# Patient Record
Sex: Male | Born: 1940
Health system: Southern US, Community
[De-identification: ages and names within clinical notes are randomized; demographics above are authoritative.]

## PROBLEM LIST (undated history)

## (undated) DIAGNOSIS — I10 Essential (primary) hypertension: Secondary | ICD-10-CM

## (undated) DIAGNOSIS — G8929 Other chronic pain: Secondary | ICD-10-CM

## (undated) DIAGNOSIS — D494 Neoplasm of unspecified behavior of bladder: Secondary | ICD-10-CM

## (undated) DIAGNOSIS — Z87442 Personal history of urinary calculi: Secondary | ICD-10-CM

## (undated) DIAGNOSIS — E119 Type 2 diabetes mellitus without complications: Secondary | ICD-10-CM

## (undated) DIAGNOSIS — S060X9A Concussion with loss of consciousness of unspecified duration, initial encounter: Secondary | ICD-10-CM

## (undated) DIAGNOSIS — M1990 Unspecified osteoarthritis, unspecified site: Secondary | ICD-10-CM

## (undated) HISTORY — PX: TONSILLECTOMY: SUR1361

## (undated) HISTORY — PX: SHOULDER SURGERY: SHX246

## (undated) HISTORY — PX: CARPAL TUNNEL RELEASE: SHX101

---

## 2015-04-11 ENCOUNTER — Encounter (HOSPITAL_COMMUNITY): Payer: Self-pay | Admitting: *Deleted

## 2015-04-11 ENCOUNTER — Observation Stay (HOSPITAL_COMMUNITY)
Admission: EM | Admit: 2015-04-11 | Discharge: 2015-04-11 | Disposition: A | Discharge status: Home / Self Care | Payer: Medicare Other | Attending: Internal Medicine | Admitting: Internal Medicine

## 2015-04-11 DIAGNOSIS — E1122 Type 2 diabetes mellitus with diabetic chronic kidney disease: Secondary | ICD-10-CM | POA: Diagnosis not present

## 2015-04-11 DIAGNOSIS — Z794 Long term (current) use of insulin: Secondary | ICD-10-CM | POA: Diagnosis not present

## 2015-04-11 DIAGNOSIS — T383X1A Poisoning by insulin and oral hypoglycemic [antidiabetic] drugs, accidental (unintentional), initial encounter: Secondary | ICD-10-CM | POA: Diagnosis not present

## 2015-04-11 DIAGNOSIS — E11649 Type 2 diabetes mellitus with hypoglycemia without coma: Secondary | ICD-10-CM | POA: Insufficient documentation

## 2015-04-11 DIAGNOSIS — E162 Hypoglycemia, unspecified: Secondary | ICD-10-CM | POA: Diagnosis present

## 2015-04-11 DIAGNOSIS — I10 Essential (primary) hypertension: Secondary | ICD-10-CM | POA: Diagnosis present

## 2015-04-11 DIAGNOSIS — E1129 Type 2 diabetes mellitus with other diabetic kidney complication: Secondary | ICD-10-CM | POA: Diagnosis present

## 2015-04-11 DIAGNOSIS — Y92009 Unspecified place in unspecified non-institutional (private) residence as the place of occurrence of the external cause: Secondary | ICD-10-CM | POA: Insufficient documentation

## 2015-04-11 DIAGNOSIS — N183 Chronic kidney disease, stage 3 (moderate): Secondary | ICD-10-CM | POA: Diagnosis not present

## 2015-04-11 DIAGNOSIS — Z79899 Other long term (current) drug therapy: Secondary | ICD-10-CM | POA: Diagnosis not present

## 2015-04-11 DIAGNOSIS — I129 Hypertensive chronic kidney disease with stage 1 through stage 4 chronic kidney disease, or unspecified chronic kidney disease: Secondary | ICD-10-CM | POA: Insufficient documentation

## 2015-04-11 DIAGNOSIS — Z833 Family history of diabetes mellitus: Secondary | ICD-10-CM | POA: Insufficient documentation

## 2015-04-11 DIAGNOSIS — Z7982 Long term (current) use of aspirin: Secondary | ICD-10-CM | POA: Insufficient documentation

## 2015-04-11 HISTORY — DX: Essential (primary) hypertension: I10

## 2015-04-11 HISTORY — DX: Type 2 diabetes mellitus without complications: E11.9

## 2015-04-11 LAB — BASIC METABOLIC PANEL
ANION GAP: 8 (ref 5–15)
BUN: 29 mg/dL — ABNORMAL HIGH (ref 6–20)
CO2: 26 mmol/L (ref 22–32)
Calcium: 8.7 mg/dL — ABNORMAL LOW (ref 8.9–10.3)
Chloride: 102 mmol/L (ref 101–111)
Creatinine, Ser: 1.41 mg/dL — ABNORMAL HIGH (ref 0.61–1.24)
GFR calc Af Amer: 56 mL/min — ABNORMAL LOW (ref >60)
GFR, EST NON AFRICAN AMERICAN: 48 mL/min — AB (ref >60)
GLUCOSE: 198 mg/dL — AB (ref 65–99)
Potassium: 3.7 mmol/L (ref 3.5–5.1)
Sodium: 136 mmol/L (ref 135–145)

## 2015-04-11 LAB — URINALYSIS, ROUTINE W REFLEX MICROSCOPIC
Bilirubin Urine: NEGATIVE
Glucose, UA: NEGATIVE mg/dL
Hgb urine dipstick: NEGATIVE
Ketones, ur: NEGATIVE mg/dL
Leukocytes, UA: NEGATIVE
Nitrite: NEGATIVE
PH: 6 (ref 5.0–8.0)
Protein, ur: NEGATIVE mg/dL
Specific Gravity, Urine: 1.018 (ref 1.005–1.030)
UROBILINOGEN UA: 0.2 mg/dL (ref 0.0–1.0)

## 2015-04-11 LAB — CBG MONITORING, ED
GLUCOSE-CAPILLARY: 72 mg/dL (ref 65–99)
GLUCOSE-CAPILLARY: 97 mg/dL (ref 65–99)
GLUCOSE-CAPILLARY: 74 mg/dL (ref 65–99)
GLUCOSE-CAPILLARY: 99 mg/dL (ref 65–99)
GLUCOSE-CAPILLARY: 67 mg/dL (ref 65–99)
Glucose-Capillary: 111 mg/dL — ABNORMAL HIGH (ref 65–99)
Glucose-Capillary: 64 mg/dL — ABNORMAL LOW (ref 65–99)
Glucose-Capillary: 78 mg/dL (ref 65–99)

## 2015-04-11 LAB — GLUCOSE, CAPILLARY
GLUCOSE-CAPILLARY: 234 mg/dL — AB (ref 65–99)
GLUCOSE-CAPILLARY: 317 mg/dL — AB (ref 65–99)
Glucose-Capillary: 170 mg/dL — ABNORMAL HIGH (ref 65–99)
Glucose-Capillary: 244 mg/dL — ABNORMAL HIGH (ref 65–99)

## 2015-04-11 LAB — CBC
HEMATOCRIT: 39.5 % (ref 39.0–52.0)
Hemoglobin: 14 g/dL (ref 13.0–17.0)
MCH: 29.9 pg (ref 26.0–34.0)
MCHC: 35.4 g/dL (ref 30.0–36.0)
MCV: 84.4 fL (ref 78.0–100.0)
Platelets: 174 10*3/uL (ref 150–400)
RBC: 4.68 MIL/uL (ref 4.22–5.81)
RDW: 12.8 % (ref 11.5–15.5)
WBC: 9.2 10*3/uL (ref 4.0–10.5)

## 2015-04-11 MED ORDER — DEXTROSE-NACL 5-0.9 % IV SOLN
INTRAVENOUS | Status: DC
  Administered 2015-04-11: 09:00:00 via INTRAVENOUS

## 2015-04-11 MED ORDER — HYDROCHLOROTHIAZIDE 12.5 MG PO CAPS
12.5000 mg | ORAL_CAPSULE | Freq: Every day | ORAL | Status: DC
  Administered 2015-04-11: 12.5 mg via ORAL
  Filled 2015-04-11: qty 1

## 2015-04-11 MED ORDER — DEXTROSE 5 % IV BOLUS
500.0000 mL | Freq: Once | INTRAVENOUS | Status: AC
  Administered 2015-04-11: 500 mL via INTRAVENOUS

## 2015-04-11 MED ORDER — ASPIRIN 325 MG PO TABS
325.0000 mg | ORAL_TABLET | ORAL | Status: DC
  Administered 2015-04-11: 325 mg via ORAL
  Filled 2015-04-11: qty 1

## 2015-04-11 MED ORDER — ACETAMINOPHEN 325 MG PO TABS
650.0000 mg | ORAL_TABLET | Freq: Four times a day (QID) | ORAL | Status: DC | PRN
  Filled 2015-04-11: qty 2

## 2015-04-11 MED ORDER — DEXTROSE 50 % IV SOLN
25.0000 mL | INTRAVENOUS | Status: DC | PRN
  Administered 2015-04-11: 25 mL via INTRAVENOUS
  Filled 2015-04-11: qty 50

## 2015-04-11 MED ORDER — LISINOPRIL 20 MG PO TABS
20.0000 mg | ORAL_TABLET | Freq: Every day | ORAL | Status: DC
  Administered 2015-04-11: 20 mg via ORAL
  Filled 2015-04-11: qty 1

## 2015-04-11 MED ORDER — LISINOPRIL-HYDROCHLOROTHIAZIDE 20-12.5 MG PO TABS: 1.0000 | ORAL_TABLET | Freq: Every day | ORAL | Status: DC

## 2015-04-11 MED ORDER — ONDANSETRON HCL 4 MG PO TABS: 4.0000 mg | ORAL_TABLET | Freq: Four times a day (QID) | ORAL | Status: DC | PRN

## 2015-04-11 MED ORDER — DEXTROSE 5 % IV BOLUS: 250.0000 mL | Freq: Once | INTRAVENOUS | Status: DC

## 2015-04-11 MED ORDER — ACETAMINOPHEN 650 MG RE SUPP: 650.0000 mg | Freq: Four times a day (QID) | RECTAL | Status: DC | PRN

## 2015-04-11 MED ORDER — ALUM & MAG HYDROXIDE-SIMETH 200-200-20 MG/5ML PO SUSP: 30.0000 mL | Freq: Four times a day (QID) | ORAL | Status: DC | PRN

## 2015-04-11 MED ORDER — DEXTROSE 5 % IV SOLN
INTRAVENOUS | Status: DC
  Administered 2015-04-11: 50 mL via INTRAVENOUS

## 2015-04-11 MED ORDER — ENOXAPARIN SODIUM 40 MG/0.4ML ~~LOC~~ SOLN
40.0000 mg | SUBCUTANEOUS | Status: DC
  Administered 2015-04-11: 40 mg via SUBCUTANEOUS
  Filled 2015-04-11: qty 0.4

## 2015-04-11 MED ORDER — ADULT MULTIVITAMIN W/MINERALS CH
1.0000 | ORAL_TABLET | Freq: Every day | ORAL | Status: DC
  Administered 2015-04-11: 1 via ORAL
  Filled 2015-04-11: qty 1

## 2015-04-11 MED ORDER — ONDANSETRON HCL 4 MG/2ML IJ SOLN: 4.0000 mg | Freq: Four times a day (QID) | INTRAMUSCULAR | Status: DC | PRN

## 2015-04-11 NOTE — Discharge Summary (Signed)
Physician Discharge Summary  George Morgan RJJ:[id number] DOB: 31-Jan-1941 DOA: 04/11/2015  PCP: Pcp Not In System  Admit date: 04/11/2015 Discharge date: 04/11/2015   Recommendations for Outpatient Follow-Up:   1. Patient will follow-up with his PCP when he returns home.   Discharge Diagnosis:   Principal Problem:    Hypoglycemia secondary to unintentional overdose of short acting insulin Active Problems:    Diabetes mellitus with renal manifestation    Essential hypertension   Discharge disposition:  Home.   Discharge Condition: Improved.  Diet recommendation: Low sodium, heart healthy.  Carbohydrate-modified.     History of Present Illness:   Patient is a 74 year old male who presented to the emergency department after accidentally confusing his short acting insulin with his Lantus and subsequently developing blood sugars in the 60s.   Hospital Course by Problem:   Principal Problem:  Hypoglycemia in the setting of insulin-dependent type 2 diabetes - Secondary to unintentional overdose of short acting insulin. - Patient's blood sugars normalized and became elevated after holding his insulin and placing him on a dextrose infusion. - Maintained blood sugars after dextrose infusion discontinued.  Active Problems:  Diabetes mellitus with renal manifestation - Normally takes both long and short acting insulins. - Elevated creatinine consistent with underlying stage III chronic kidney disease.   Essential hypertension - Takes lisinopril/HCTZ at home.    Medical Consultants:    None.   Discharge Exam:   Filed Vitals:   04/11/15 1445  BP: 135/76  Pulse: 65  Temp: 97.8 F (36.6 C)  Resp: 20   Filed Vitals:   04/11/15 0500 04/11/15 0530 04/11/15 0720 04/11/15 1445  BP: 149/78 145/75 139/76 135/76  Pulse: 68 66 71 65  Temp:   97.8 F (36.6 C) 97.8 F (36.6 C)  TempSrc:   Oral Oral  Resp: 20 20 18 20   Height:   5\' 7"  (1.702 m)   Weight:    102.4 kg (225 lb 12 oz)   SpO2: 96% 95% 98% 98%    Gen:  NAD Cardiovascular:  RRR, No M/R/G Respiratory: Lungs CTAB Gastrointestinal: Abdomen soft, NT/ND with normal active bowel sounds. Extremities: No C/E/C   The results of significant diagnostics from this hospitalization (including imaging, microbiology, ancillary and laboratory) are listed below for reference.     Procedures and Diagnostic Studies:   None   Labs:   Basic Metabolic Panel:  Recent Labs Lab 04/11/15 0703  NA 136  K 3.7  CL 102  CO2 26  GLUCOSE 198*  BUN 29*  CREATININE 1.41*  CALCIUM 8.7*   GFR Estimated Creatinine Clearance: 53.2 mL/min (by C-G formula based on Cr of 1.41).  CBC:  Recent Labs Lab 04/11/15 0703  WBC 9.2  HGB 14.0  HCT 39.5  MCV 84.4  PLT 174   CBG:  Recent Labs Lab 04/11/15 0558 04/11/15 0802 04/11/15 1011 04/11/15 1218 04/11/15 1419  GLUCAP 67 170* 234* 244* 317*     Discharge Instructions:   Discharge Instructions    Call MD for:    Complete by:  As directed   Recurrent hypoglycemia.     Diet Carb Modified    Complete by:  As directed      Discharge instructions    Complete by:  As directed   Check your blood sugars frequently over the next 24 hours to make sure they don't drop again.     Increase activity slowly    Complete by:  As directed  Medication List    TAKE these medications        aspirin 325 MG tablet  Take 325 mg by mouth 3 (three) times a week.     HUMALOG KWIKPEN 100 UNIT/ML KiwkPen  Generic drug:  insulin lispro  Inject 0-20 Units into the skin 3 (three) times daily. Per sliding scale     insulin glargine 100 UNIT/ML injection  Commonly known as:  LANTUS  Inject 35-65 Units into the skin at bedtime. Per sliding scale     lisinopril-hydrochlorothiazide 20-12.5 MG per tablet  Commonly known as:  PRINZIDE,ZESTORETIC  Take 1 tablet by mouth daily.     multivitamin with minerals Tabs tablet  Take 1 tablet by  mouth daily.           Follow-up Information    Schedule an appointment as soon as possible for a visit with Your regular MD.   Why:  As needed       Time coordinating discharge: 30 minutes.  Signed:  Camry Theiss  Pager [phone] [facility] 04/11/2015, 5:11 PM

## 2015-04-11 NOTE — Discharge Instructions (Signed)
Low Blood Sugar °Low blood sugar (hypoglycemia) means that the level of sugar in your blood is lower than it should be. Signs of low blood sugar include: °· Getting sweaty. °· Feeling hungry. °· Feeling dizzy or weak. °· Feeling sleepier than normal. °· Feeling nervous. °· Headaches. °· Having a fast heartbeat. °Low blood sugar can happen fast and can be an emergency. Your doctor can do tests to check your blood sugar level. You can have low blood sugar and not have diabetes. °HOME CARE °· Check your blood sugar as told by your doctor. If it is less than 70 mg/dl or as told by your doctor, take 1 of the following: °¨ 3 to 4 glucose tablets. °¨ ½ cup clear juice. °¨ ½ cup soda pop, not diet. °¨ 1 cup milk. °¨ 5 to 6 hard candies. °· Recheck blood sugar after 15 minutes. Repeat until it is at the right level. °· Eat a snack if it is more than 1 hour until the next meal. °· Only take medicine as told by your doctor. °· Do not skip meals. Eat on time. °· Do not drink alcohol except with meals. °· Check your blood glucose before driving. °· Check your blood glucose before and after exercise. °· Always carry treatment with you, such as glucose pills. °· Always wear a medical alert bracelet if you have diabetes. °GET HELP RIGHT AWAY IF:  °· Your blood glucose goes below 70 mg/dl or as told by your doctor, and you: °¨ Are confused. °¨ Are not able to swallow. °¨ Pass out (faint). °· You cannot treat yourself. You may need someone to help you. °· You have low blood sugar problems often. °· You have problems from your medicines. °· You are not feeling better after 3 to 4 days. °· You have vision changes. °MAKE SURE YOU:  °· Understand these instructions. °· Will watch this condition. °· Will get help right away if you are not doing well or get worse. °Document Released: 11/26/2009 Document Revised: 11/24/2011 Document Reviewed: 11/26/2009 °[employer]® Patient Information ©2015 [employer]. This information is not intended to  replace advice given to you by your health care provider. Make sure you discuss any questions you have with your health care provider. ° °

## 2015-04-11 NOTE — ED Provider Notes (Signed)
CSN: [id number]     Arrival date & time 04/11/15  0009 History   First MD Initiated Contact with Patient 04/11/15 0035     Chief Complaint  Patient presents with  . Took wrong insulin      (Consider location/radiation/quality/duration/timing/severity/associated sxs/prior Treatment) HPI Comments: Patient is a 74 yo M PMHx significant for HTN, DM presenting to the ED for hypoglycemia. Patient states he accidentally took 58 units of his Humalog instead of his Lantus this evening at 11PM. He states his last blood glucose at home was 176. Patient denies any physical complaints. He's otherwise been well. Denies any fevers, nausea, vomiting, chest pain, shortness of breath, abdominal pain, urinary symptoms.    Past Medical History  Diagnosis Date  . Diabetes mellitus without complication   . Hypertension    Past Surgical History  Procedure Laterality Date  . Tonsillectomy     No family history on file. History  Substance Use Topics  . Smoking status: Never Smoker   . Smokeless tobacco: Not on file  . Alcohol Use: No    Review of Systems  All other systems reviewed and are negative.     Allergies  Review of patient's allergies indicates no known allergies.  Home Medications   Prior to Admission medications   Medication Sig Start Date End Date Taking? Authorizing Provider  aspirin 325 MG tablet Take 325 mg by mouth 3 (three) times a week.   Yes Historical Provider, MD  insulin glargine (LANTUS) 100 UNIT/ML injection Inject 35-65 Units into the skin at bedtime. Per sliding scale   Yes Historical Provider, MD  insulin lispro (HUMALOG KWIKPEN) 100 UNIT/ML KiwkPen Inject 0-20 Units into the skin 3 (three) times daily. Per sliding scale   Yes Historical Provider, MD  lisinopril-hydrochlorothiazide (PRINZIDE,ZESTORETIC) 20-12.5 MG per tablet Take 1 tablet by mouth daily.   Yes Historical Provider, MD  Multiple Vitamin (MULTIVITAMIN WITH MINERALS) TABS tablet Take 1 tablet by mouth  daily.   Yes Historical Provider, MD   BP 145/75 mmHg  Pulse 66  Temp(Src) 97.7 F (36.5 C) (Oral)  Resp 20  SpO2 95% Physical Exam  Constitutional: He is oriented to person, place, and time. He appears well-developed and well-nourished. No distress.  HENT:  Head: Normocephalic and atraumatic.  Right Ear: External ear normal.  Left Ear: External ear normal.  Nose: Nose normal.  Mouth/Throat: Oropharynx is clear and moist.  Eyes: Conjunctivae are normal.  Neck: Normal range of motion. Neck supple.  No nuchal rigidity.   Cardiovascular: Normal rate, regular rhythm and normal heart sounds.   Pulmonary/Chest: Effort normal and breath sounds normal. No respiratory distress.  Abdominal: Soft. There is no tenderness.  Musculoskeletal: Normal range of motion.  Neurological: He is alert and oriented to person, place, and time.  Skin: Skin is warm and dry. He is not diaphoretic.  Psychiatric: He has a normal mood and affect.  Nursing note and vitals reviewed.   ED Course  Procedures (including critical care time) Medications  dextrose 5 % solution (not administered)  dextrose 50 % solution 25 mL (not administered)  dextrose 5 % bolus 500 mL (0 mLs Intravenous Stopped 04/11/15 0451)    Labs Review Labs Reviewed  CBG MONITORING, ED - Abnormal; Notable for the following:    Glucose-Capillary 64 (*)    All other components within normal limits  CBG MONITORING, ED - Abnormal; Notable for the following:    Glucose-Capillary 111 (*)    All other components within normal  limits  URINALYSIS, ROUTINE W REFLEX MICROSCOPIC (NOT AT [facility])  CBC  BASIC METABOLIC PANEL  CBG MONITORING, ED  CBG MONITORING, ED  CBG MONITORING, ED  CBG MONITORING, ED  CBG MONITORING, ED  CBG MONITORING, ED  CBG MONITORING, ED    Imaging Review No results found.   EKG Interpretation None      MDM   Final diagnoses:  Hypoglycemia    Filed Vitals:   04/11/15 0530  BP: 145/75  Pulse: 66  Temp:    Resp: 20   Patient with fluctuating hypoglycemia despite IV D5 and PO intake. Will admit for observation to Dr. Blaine Hamper.   Patient d/w with Dr. Sharol Given, agrees with plan.    Baron Sane, PA-C 04/11/15 5797  Linton Flemings, MD 04/12/15 [phone]

## 2015-04-11 NOTE — Progress Notes (Signed)
Discharge instructions given to pt, verbalized understanding. Left the unit in stable condition. 

## 2015-04-11 NOTE — ED Notes (Signed)
Pt reports taking humalog 58 units at 2300 instead of lantus.  Last BG was 176.

## 2015-04-11 NOTE — ED Notes (Signed)
Patient has been given a sandwich with white bread, 21ml of orange juice, graham crackers, and peanut butter.

## 2015-04-11 NOTE — H&P (Signed)
History and Physical:    George Morgan   VEH:[id number] DOB: December 04, 1940 DOA: 04/11/2015  Referring MD/provider: Baron Sane, PA-C [place]CP[place]  Chief Complaint: Low blood sugar  History of Present Illness:   George Morgan is an 74 y.o. male with a PMH of insulin dependent type II DM who presents with hypoglycemia related to accidentally taking too much of his Humalog (took 58 units of Humalog instead of Lantus) last evening at 11:00 pm.  The patient immediately realized his mistake, then came to the ED for further evaluation.  His sugar got as low as in th 50's before he was started on a glucose drip in the ED.  No associated symptoms of hypoglycemia.  He was referred for an observation admission.  Feels a bit tired from not sleeping much last night, but otherwise endorsed no specific symptoms.  ROS:   Review of Systems  Constitutional: Negative for fever, chills, weight loss, malaise/fatigue and diaphoresis.  HENT: Negative.   Eyes: Negative.   Respiratory: Negative.   Cardiovascular: Negative.   Gastrointestinal: Negative.   Genitourinary: Negative.   Musculoskeletal: Negative.   Skin: Negative.   Neurological: Negative.  Negative for weakness.  Endo/Heme/Allergies: Negative.   Psychiatric/Behavioral: Negative.     Past Medical History:   Past Medical History  Diagnosis Date  . Diabetes mellitus without complication   . Hypertension     Past Surgical History:   Past Surgical History  Procedure Laterality Date  . Tonsillectomy    . Shoulder surgery Right   . Carpal tunnel release Left     Social History:   History   Social History  . Marital Status: Widowed    Spouse Name: N/A  . Number of Children: 4  . Years of Education: N/A   Occupational History  . Not on file.   Social History Main Topics  . Smoking status: Never Smoker   . Smokeless tobacco: Not on file  . Alcohol Use: No  . Drug Use: No  . Sexual  Activity: Not on file   Other Topics Concern  . Not on file   Social History Narrative   Visiting from [place].  Widowed.  Independent of ADLs.    Family history:   Family History  Problem Relation Age of Onset  . Diabetes Mother   . Diabetes Father   . Hypertension Mother   . Hypertension Father   . Heart disease Mother   . Heart disease Father   . Lung cancer Brother     Allergies   Review of patient's allergies indicates no known allergies.  Current Medications:   Prior to Admission medications   Medication Sig Start Date End Date Taking? Authorizing Provider  aspirin 325 MG tablet Take 325 mg by mouth 3 (three) times a week.   Yes Historical Provider, MD  insulin glargine (LANTUS) 100 UNIT/ML injection Inject 35-65 Units into the skin at bedtime. Per sliding scale   Yes Historical Provider, MD  insulin lispro (HUMALOG KWIKPEN) 100 UNIT/ML KiwkPen Inject 0-20 Units into the skin 3 (three) times daily. Per sliding scale   Yes Historical Provider, MD  lisinopril-hydrochlorothiazide (PRINZIDE,ZESTORETIC) 20-12.5 MG per tablet Take 1 tablet by mouth daily.   Yes Historical Provider, MD  Multiple Vitamin (MULTIVITAMIN WITH MINERALS) TABS tablet Take 1 tablet by mouth daily.   Yes Historical Provider, MD    Physical Exam:   Filed Vitals:   04/11/15 0500 04/11/15 0530 04/11/15 0720  04/11/15 1445  BP: 149/78 145/75 139/76 135/76  Pulse: 68 66 71 65  Temp:   97.8 F (36.6 C) 97.8 F (36.6 C)  TempSrc:   Oral Oral  Resp: 20 20 18 20   Height:   5\' 7"  (1.702 m)   Weight:   102.4 kg (225 lb 12 oz)   SpO2: 96% 95% 98% 98%     Physical Exam: Blood pressure 135/76, pulse 65, temperature 97.8 F (36.6 C), temperature source Oral, resp. rate 20, height 5\' 7"  (1.702 m), weight 102.4 kg (225 lb 12 oz), SpO2 98 %. Gen: No acute distress. Head: Normocephalic, atraumatic. Eyes: PERRL, EOMI, sclerae nonicteric. Mouth: Oropharynx clear. Neck: Supple, no thyromegaly, no  lymphadenopathy, no jugular venous distention. Chest: Lungs clear to auscultation bilaterally. CV: Heart sounds are regular. No murmurs, rubs, or gallops. Abdomen: Soft, nontender, nondistended with normal active bowel sounds. Extremities: Extremities are without clubbing, edema, or cyanosis. Skin: Warm and dry. Neuro: Alert and oriented times 3; grossly nonfocal. Psych: Mood and affect normal.   Data Review:    Labs: Basic Metabolic Panel:  Recent Labs Lab 04/11/15 0703  NA 136  K 3.7  CL 102  CO2 26  GLUCOSE 198*  BUN 29*  CREATININE 1.41*  CALCIUM 8.7*   CBC:  Recent Labs Lab 04/11/15 0703  WBC 9.2  HGB 14.0  HCT 39.5  MCV 84.4  PLT 174   CBG:  Recent Labs Lab 04/11/15 0558 04/11/15 0802 04/11/15 1011 04/11/15 1218 04/11/15 1419  GLUCAP 67 170* 234* 244* 317*    Radiographic Studies: No results found.    Assessment/Plan:   Principal Problem:   Hypoglycemia in the setting of insulin-dependent type 2 diabetes - Secondary to unintentional overdose of short acting insulin. - Continue dextrose infusion. - Hold insulin for now.  Active Problems:   Diabetes mellitus with renal manifestation - Normally takes both long and short acting insulins. - Elevated creatinine consistent with underlying stage III chronic kidney disease.    Essential hypertension - Takes lisinopril/HCTZ at home.    DVT prophylaxis - Lovenox ordered.  Code Status: Full. Family Communication: Elberta Fortis (brother) Disposition Plan: Home when stable.  Time spent: 45 minutes.  RAMA,CHRISTINA [facility] Pager [phone] Cell: [phone]   If 7PM-7AM, please contact night-coverage [email or web address] Password [facility] 04/11/2015, 5:07 PM

## 2015-04-11 NOTE — Progress Notes (Signed)
Pt arrived unit from ED, alert and oriented, able to communicate needs, will continue with current plan of care.

## 2015-09-17 DIAGNOSIS — E119 Type 2 diabetes mellitus without complications: Secondary | ICD-10-CM | POA: Diagnosis not present

## 2015-09-19 DIAGNOSIS — G629 Polyneuropathy, unspecified: Secondary | ICD-10-CM | POA: Diagnosis not present

## 2015-09-19 DIAGNOSIS — M545 Low back pain: Secondary | ICD-10-CM | POA: Diagnosis not present

## 2015-09-19 DIAGNOSIS — E669 Obesity, unspecified: Secondary | ICD-10-CM | POA: Diagnosis not present

## 2015-09-19 DIAGNOSIS — N529 Male erectile dysfunction, unspecified: Secondary | ICD-10-CM | POA: Diagnosis not present

## 2015-09-19 DIAGNOSIS — I10 Essential (primary) hypertension: Secondary | ICD-10-CM | POA: Diagnosis not present

## 2015-09-19 DIAGNOSIS — E119 Type 2 diabetes mellitus without complications: Secondary | ICD-10-CM | POA: Diagnosis not present

## 2015-09-19 DIAGNOSIS — L409 Psoriasis, unspecified: Secondary | ICD-10-CM | POA: Diagnosis not present

## 2015-09-19 DIAGNOSIS — N189 Chronic kidney disease, unspecified: Secondary | ICD-10-CM | POA: Diagnosis not present

## 2015-09-19 DIAGNOSIS — E785 Hyperlipidemia, unspecified: Secondary | ICD-10-CM | POA: Diagnosis not present

## 2015-09-19 DIAGNOSIS — M1990 Unspecified osteoarthritis, unspecified site: Secondary | ICD-10-CM | POA: Diagnosis not present

## 2016-01-14 DIAGNOSIS — E119 Type 2 diabetes mellitus without complications: Secondary | ICD-10-CM | POA: Diagnosis not present

## 2016-01-14 DIAGNOSIS — Z125 Encounter for screening for malignant neoplasm of prostate: Secondary | ICD-10-CM | POA: Diagnosis not present

## 2016-01-16 DIAGNOSIS — E119 Type 2 diabetes mellitus without complications: Secondary | ICD-10-CM | POA: Diagnosis not present

## 2016-01-16 DIAGNOSIS — G629 Polyneuropathy, unspecified: Secondary | ICD-10-CM | POA: Diagnosis not present

## 2016-01-16 DIAGNOSIS — E785 Hyperlipidemia, unspecified: Secondary | ICD-10-CM | POA: Diagnosis not present

## 2016-01-16 DIAGNOSIS — N529 Male erectile dysfunction, unspecified: Secondary | ICD-10-CM | POA: Diagnosis not present

## 2016-01-16 DIAGNOSIS — M1990 Unspecified osteoarthritis, unspecified site: Secondary | ICD-10-CM | POA: Diagnosis not present

## 2016-01-16 DIAGNOSIS — M545 Low back pain: Secondary | ICD-10-CM | POA: Diagnosis not present

## 2016-01-16 DIAGNOSIS — E669 Obesity, unspecified: Secondary | ICD-10-CM | POA: Diagnosis not present

## 2016-01-16 DIAGNOSIS — L409 Psoriasis, unspecified: Secondary | ICD-10-CM | POA: Diagnosis not present

## 2016-01-16 DIAGNOSIS — I10 Essential (primary) hypertension: Secondary | ICD-10-CM | POA: Diagnosis not present

## 2016-01-16 DIAGNOSIS — N189 Chronic kidney disease, unspecified: Secondary | ICD-10-CM | POA: Diagnosis not present

## 2016-01-30 DIAGNOSIS — Z125 Encounter for screening for malignant neoplasm of prostate: Secondary | ICD-10-CM | POA: Diagnosis not present

## 2016-01-30 DIAGNOSIS — N528 Other male erectile dysfunction: Secondary | ICD-10-CM | POA: Diagnosis not present

## 2016-01-30 DIAGNOSIS — N401 Enlarged prostate with lower urinary tract symptoms: Secondary | ICD-10-CM | POA: Diagnosis not present

## 2016-06-26 DIAGNOSIS — M25511 Pain in right shoulder: Secondary | ICD-10-CM | POA: Diagnosis not present

## 2016-06-26 DIAGNOSIS — H538 Other visual disturbances: Secondary | ICD-10-CM | POA: Diagnosis not present

## 2016-06-26 DIAGNOSIS — Z794 Long term (current) use of insulin: Secondary | ICD-10-CM | POA: Diagnosis not present

## 2016-06-26 DIAGNOSIS — R3989 Other symptoms and signs involving the genitourinary system: Secondary | ICD-10-CM | POA: Diagnosis not present

## 2016-06-26 DIAGNOSIS — I10 Essential (primary) hypertension: Secondary | ICD-10-CM | POA: Diagnosis not present

## 2016-06-26 DIAGNOSIS — L409 Psoriasis, unspecified: Secondary | ICD-10-CM | POA: Diagnosis not present

## 2016-06-26 DIAGNOSIS — G8929 Other chronic pain: Secondary | ICD-10-CM | POA: Diagnosis not present

## 2016-06-26 DIAGNOSIS — E7800 Pure hypercholesterolemia, unspecified: Secondary | ICD-10-CM | POA: Diagnosis not present

## 2016-06-26 DIAGNOSIS — E1165 Type 2 diabetes mellitus with hyperglycemia: Secondary | ICD-10-CM | POA: Diagnosis not present

## 2016-07-09 DIAGNOSIS — M12811 Other specific arthropathies, not elsewhere classified, right shoulder: Secondary | ICD-10-CM | POA: Diagnosis not present

## 2016-07-21 DIAGNOSIS — Z961 Presence of intraocular lens: Secondary | ICD-10-CM | POA: Diagnosis not present

## 2016-07-21 DIAGNOSIS — E109 Type 1 diabetes mellitus without complications: Secondary | ICD-10-CM | POA: Diagnosis not present

## 2016-07-21 DIAGNOSIS — Z0100 Encounter for examination of eyes and vision without abnormal findings: Secondary | ICD-10-CM | POA: Diagnosis not present

## 2016-07-23 DIAGNOSIS — L400 Psoriasis vulgaris: Secondary | ICD-10-CM | POA: Diagnosis not present

## 2016-07-23 DIAGNOSIS — L570 Actinic keratosis: Secondary | ICD-10-CM | POA: Diagnosis not present

## 2016-07-23 DIAGNOSIS — L565 Disseminated superficial actinic porokeratosis (DSAP): Secondary | ICD-10-CM | POA: Diagnosis not present

## 2016-07-23 DIAGNOSIS — C4441 Basal cell carcinoma of skin of scalp and neck: Secondary | ICD-10-CM | POA: Diagnosis not present

## 2016-08-05 DIAGNOSIS — C4441 Basal cell carcinoma of skin of scalp and neck: Secondary | ICD-10-CM | POA: Diagnosis not present

## 2016-08-06 DIAGNOSIS — Z23 Encounter for immunization: Secondary | ICD-10-CM | POA: Diagnosis not present

## 2016-08-25 DIAGNOSIS — R972 Elevated prostate specific antigen [PSA]: Secondary | ICD-10-CM | POA: Diagnosis not present

## 2016-10-09 DIAGNOSIS — E1369 Other specified diabetes mellitus with other specified complication: Secondary | ICD-10-CM | POA: Diagnosis not present

## 2016-10-09 DIAGNOSIS — I10 Essential (primary) hypertension: Secondary | ICD-10-CM | POA: Diagnosis not present

## 2016-10-09 DIAGNOSIS — J069 Acute upper respiratory infection, unspecified: Secondary | ICD-10-CM | POA: Diagnosis not present

## 2017-01-01 DIAGNOSIS — I10 Essential (primary) hypertension: Secondary | ICD-10-CM | POA: Diagnosis not present

## 2017-01-15 DIAGNOSIS — L409 Psoriasis, unspecified: Secondary | ICD-10-CM | POA: Diagnosis not present

## 2017-01-15 DIAGNOSIS — E1165 Type 2 diabetes mellitus with hyperglycemia: Secondary | ICD-10-CM | POA: Diagnosis not present

## 2017-01-15 DIAGNOSIS — Z794 Long term (current) use of insulin: Secondary | ICD-10-CM | POA: Diagnosis not present

## 2017-01-15 DIAGNOSIS — I10 Essential (primary) hypertension: Secondary | ICD-10-CM | POA: Diagnosis not present

## 2017-01-15 DIAGNOSIS — E7800 Pure hypercholesterolemia, unspecified: Secondary | ICD-10-CM | POA: Diagnosis not present

## 2017-01-15 DIAGNOSIS — N183 Chronic kidney disease, stage 3 (moderate): Secondary | ICD-10-CM | POA: Diagnosis not present

## 2017-01-15 DIAGNOSIS — G8929 Other chronic pain: Secondary | ICD-10-CM | POA: Diagnosis not present

## 2017-07-31 DIAGNOSIS — N138 Other obstructive and reflux uropathy: Secondary | ICD-10-CM | POA: Diagnosis not present

## 2017-08-04 DIAGNOSIS — N183 Chronic kidney disease, stage 3 (moderate): Secondary | ICD-10-CM | POA: Diagnosis not present

## 2017-08-04 DIAGNOSIS — Z794 Long term (current) use of insulin: Secondary | ICD-10-CM | POA: Diagnosis not present

## 2017-08-04 DIAGNOSIS — E7800 Pure hypercholesterolemia, unspecified: Secondary | ICD-10-CM | POA: Diagnosis not present

## 2017-08-04 DIAGNOSIS — E1165 Type 2 diabetes mellitus with hyperglycemia: Secondary | ICD-10-CM | POA: Diagnosis not present

## 2017-08-04 DIAGNOSIS — I10 Essential (primary) hypertension: Secondary | ICD-10-CM | POA: Diagnosis not present

## 2017-08-04 DIAGNOSIS — Z23 Encounter for immunization: Secondary | ICD-10-CM | POA: Diagnosis not present

## 2017-08-04 DIAGNOSIS — E1142 Type 2 diabetes mellitus with diabetic polyneuropathy: Secondary | ICD-10-CM | POA: Diagnosis not present

## 2017-08-11 DIAGNOSIS — D1801 Hemangioma of skin and subcutaneous tissue: Secondary | ICD-10-CM | POA: Diagnosis not present

## 2017-08-11 DIAGNOSIS — L570 Actinic keratosis: Secondary | ICD-10-CM | POA: Diagnosis not present

## 2017-08-11 DIAGNOSIS — L400 Psoriasis vulgaris: Secondary | ICD-10-CM | POA: Diagnosis not present

## 2017-08-11 DIAGNOSIS — D3612 Benign neoplasm of peripheral nerves and autonomic nervous system, upper limb, including shoulder: Secondary | ICD-10-CM | POA: Diagnosis not present

## 2017-08-11 DIAGNOSIS — L814 Other melanin hyperpigmentation: Secondary | ICD-10-CM | POA: Diagnosis not present

## 2017-08-26 DIAGNOSIS — H5213 Myopia, bilateral: Secondary | ICD-10-CM | POA: Diagnosis not present

## 2017-08-26 DIAGNOSIS — E119 Type 2 diabetes mellitus without complications: Secondary | ICD-10-CM | POA: Diagnosis not present

## 2017-08-26 DIAGNOSIS — H35371 Puckering of macula, right eye: Secondary | ICD-10-CM | POA: Diagnosis not present

## 2017-08-26 DIAGNOSIS — Z961 Presence of intraocular lens: Secondary | ICD-10-CM | POA: Diagnosis not present

## 2018-01-20 DIAGNOSIS — E1165 Type 2 diabetes mellitus with hyperglycemia: Secondary | ICD-10-CM | POA: Diagnosis not present

## 2018-01-20 DIAGNOSIS — N183 Chronic kidney disease, stage 3 (moderate): Secondary | ICD-10-CM | POA: Diagnosis not present

## 2018-01-20 DIAGNOSIS — E7800 Pure hypercholesterolemia, unspecified: Secondary | ICD-10-CM | POA: Diagnosis not present

## 2018-01-20 DIAGNOSIS — I10 Essential (primary) hypertension: Secondary | ICD-10-CM | POA: Diagnosis not present

## 2018-06-16 DIAGNOSIS — M1990 Unspecified osteoarthritis, unspecified site: Secondary | ICD-10-CM | POA: Insufficient documentation

## 2018-06-16 DIAGNOSIS — G629 Polyneuropathy, unspecified: Secondary | ICD-10-CM | POA: Insufficient documentation

## 2018-06-16 DIAGNOSIS — Z794 Long term (current) use of insulin: Secondary | ICD-10-CM | POA: Diagnosis not present

## 2018-06-16 DIAGNOSIS — E1140 Type 2 diabetes mellitus with diabetic neuropathy, unspecified: Secondary | ICD-10-CM | POA: Insufficient documentation

## 2018-06-16 DIAGNOSIS — R202 Paresthesia of skin: Secondary | ICD-10-CM | POA: Diagnosis not present

## 2018-06-16 DIAGNOSIS — E1122 Type 2 diabetes mellitus with diabetic chronic kidney disease: Secondary | ICD-10-CM | POA: Diagnosis not present

## 2018-06-16 DIAGNOSIS — R600 Localized edema: Secondary | ICD-10-CM | POA: Diagnosis not present

## 2018-06-16 DIAGNOSIS — E785 Hyperlipidemia, unspecified: Secondary | ICD-10-CM | POA: Insufficient documentation

## 2018-06-16 DIAGNOSIS — N189 Chronic kidney disease, unspecified: Secondary | ICD-10-CM | POA: Insufficient documentation

## 2018-06-16 DIAGNOSIS — M8448XA Pathological fracture, other site, initial encounter for fracture: Secondary | ICD-10-CM | POA: Insufficient documentation

## 2018-06-16 DIAGNOSIS — I10 Essential (primary) hypertension: Secondary | ICD-10-CM | POA: Diagnosis not present

## 2018-06-16 DIAGNOSIS — K649 Unspecified hemorrhoids: Secondary | ICD-10-CM | POA: Diagnosis not present

## 2018-06-16 DIAGNOSIS — N529 Male erectile dysfunction, unspecified: Secondary | ICD-10-CM | POA: Insufficient documentation

## 2018-06-16 DIAGNOSIS — E1142 Type 2 diabetes mellitus with diabetic polyneuropathy: Secondary | ICD-10-CM | POA: Diagnosis not present

## 2018-06-16 DIAGNOSIS — L409 Psoriasis, unspecified: Secondary | ICD-10-CM | POA: Insufficient documentation

## 2018-06-17 DIAGNOSIS — Z794 Long term (current) use of insulin: Secondary | ICD-10-CM | POA: Diagnosis not present

## 2018-06-17 DIAGNOSIS — E1122 Type 2 diabetes mellitus with diabetic chronic kidney disease: Secondary | ICD-10-CM | POA: Diagnosis not present

## 2018-06-17 DIAGNOSIS — R600 Localized edema: Secondary | ICD-10-CM | POA: Diagnosis not present

## 2018-06-30 DIAGNOSIS — Z23 Encounter for immunization: Secondary | ICD-10-CM | POA: Diagnosis not present

## 2018-06-30 DIAGNOSIS — R600 Localized edema: Secondary | ICD-10-CM | POA: Diagnosis not present

## 2018-06-30 DIAGNOSIS — I10 Essential (primary) hypertension: Secondary | ICD-10-CM | POA: Diagnosis not present

## 2018-06-30 DIAGNOSIS — N189 Chronic kidney disease, unspecified: Secondary | ICD-10-CM | POA: Diagnosis not present

## 2018-06-30 DIAGNOSIS — E1142 Type 2 diabetes mellitus with diabetic polyneuropathy: Secondary | ICD-10-CM | POA: Diagnosis not present

## 2018-07-06 DIAGNOSIS — E538 Deficiency of other specified B group vitamins: Secondary | ICD-10-CM | POA: Diagnosis not present

## 2018-07-06 DIAGNOSIS — G629 Polyneuropathy, unspecified: Secondary | ICD-10-CM | POA: Diagnosis not present

## 2018-07-12 DIAGNOSIS — G629 Polyneuropathy, unspecified: Secondary | ICD-10-CM | POA: Diagnosis not present

## 2018-07-12 DIAGNOSIS — E538 Deficiency of other specified B group vitamins: Secondary | ICD-10-CM | POA: Diagnosis not present

## 2018-07-22 DIAGNOSIS — F419 Anxiety disorder, unspecified: Secondary | ICD-10-CM | POA: Diagnosis not present

## 2018-07-22 DIAGNOSIS — E119 Type 2 diabetes mellitus without complications: Secondary | ICD-10-CM | POA: Diagnosis not present

## 2018-07-22 DIAGNOSIS — K219 Gastro-esophageal reflux disease without esophagitis: Secondary | ICD-10-CM | POA: Diagnosis not present

## 2018-07-22 DIAGNOSIS — E668 Other obesity: Secondary | ICD-10-CM | POA: Diagnosis not present

## 2018-07-22 DIAGNOSIS — K921 Melena: Secondary | ICD-10-CM | POA: Diagnosis not present

## 2018-07-22 DIAGNOSIS — I10 Essential (primary) hypertension: Secondary | ICD-10-CM | POA: Diagnosis not present

## 2018-07-30 DIAGNOSIS — M5416 Radiculopathy, lumbar region: Secondary | ICD-10-CM | POA: Diagnosis not present

## 2018-08-09 DIAGNOSIS — K921 Melena: Secondary | ICD-10-CM | POA: Diagnosis not present

## 2018-08-09 DIAGNOSIS — R12 Heartburn: Secondary | ICD-10-CM | POA: Diagnosis not present

## 2018-08-09 DIAGNOSIS — K2930 Chronic superficial gastritis without bleeding: Secondary | ICD-10-CM | POA: Diagnosis not present

## 2018-08-09 DIAGNOSIS — K5730 Diverticulosis of large intestine without perforation or abscess without bleeding: Secondary | ICD-10-CM | POA: Diagnosis not present

## 2018-08-09 DIAGNOSIS — D122 Benign neoplasm of ascending colon: Secondary | ICD-10-CM | POA: Diagnosis not present

## 2018-08-09 DIAGNOSIS — K635 Polyp of colon: Secondary | ICD-10-CM | POA: Diagnosis not present

## 2018-08-09 DIAGNOSIS — K641 Second degree hemorrhoids: Secondary | ICD-10-CM | POA: Diagnosis not present

## 2018-08-09 DIAGNOSIS — R1310 Dysphagia, unspecified: Secondary | ICD-10-CM | POA: Diagnosis not present

## 2018-08-09 DIAGNOSIS — K2960 Other gastritis without bleeding: Secondary | ICD-10-CM | POA: Diagnosis not present

## 2018-08-20 DIAGNOSIS — M5417 Radiculopathy, lumbosacral region: Secondary | ICD-10-CM | POA: Diagnosis not present

## 2018-08-24 DIAGNOSIS — G8929 Other chronic pain: Secondary | ICD-10-CM | POA: Diagnosis not present

## 2018-08-24 DIAGNOSIS — E1142 Type 2 diabetes mellitus with diabetic polyneuropathy: Secondary | ICD-10-CM | POA: Diagnosis not present

## 2018-08-24 DIAGNOSIS — I10 Essential (primary) hypertension: Secondary | ICD-10-CM | POA: Diagnosis not present

## 2018-08-24 DIAGNOSIS — Z794 Long term (current) use of insulin: Secondary | ICD-10-CM | POA: Diagnosis not present

## 2018-08-24 DIAGNOSIS — M545 Low back pain: Secondary | ICD-10-CM | POA: Diagnosis not present

## 2018-08-24 DIAGNOSIS — R609 Edema, unspecified: Secondary | ICD-10-CM | POA: Diagnosis not present

## 2018-08-24 DIAGNOSIS — E1122 Type 2 diabetes mellitus with diabetic chronic kidney disease: Secondary | ICD-10-CM | POA: Diagnosis not present

## 2018-08-31 DIAGNOSIS — M5416 Radiculopathy, lumbar region: Secondary | ICD-10-CM | POA: Diagnosis not present

## 2018-09-03 DIAGNOSIS — M5416 Radiculopathy, lumbar region: Secondary | ICD-10-CM | POA: Diagnosis not present

## 2018-09-06 DIAGNOSIS — M5416 Radiculopathy, lumbar region: Secondary | ICD-10-CM | POA: Diagnosis not present

## 2018-09-09 DIAGNOSIS — M5416 Radiculopathy, lumbar region: Secondary | ICD-10-CM | POA: Diagnosis not present

## 2018-09-10 DIAGNOSIS — M5416 Radiculopathy, lumbar region: Secondary | ICD-10-CM | POA: Diagnosis not present

## 2018-09-13 DIAGNOSIS — M5416 Radiculopathy, lumbar region: Secondary | ICD-10-CM | POA: Diagnosis not present

## 2018-09-16 DIAGNOSIS — M5416 Radiculopathy, lumbar region: Secondary | ICD-10-CM | POA: Diagnosis not present

## 2018-09-17 DIAGNOSIS — M5416 Radiculopathy, lumbar region: Secondary | ICD-10-CM | POA: Diagnosis not present

## 2018-09-20 DIAGNOSIS — M5416 Radiculopathy, lumbar region: Secondary | ICD-10-CM | POA: Diagnosis not present

## 2018-09-22 DIAGNOSIS — M5416 Radiculopathy, lumbar region: Secondary | ICD-10-CM | POA: Diagnosis not present

## 2018-09-24 DIAGNOSIS — M5416 Radiculopathy, lumbar region: Secondary | ICD-10-CM | POA: Diagnosis not present

## 2018-09-24 DIAGNOSIS — M5117 Intervertebral disc disorders with radiculopathy, lumbosacral region: Secondary | ICD-10-CM | POA: Diagnosis not present

## 2018-09-24 DIAGNOSIS — M5126 Other intervertebral disc displacement, lumbar region: Secondary | ICD-10-CM | POA: Diagnosis not present

## 2018-09-24 DIAGNOSIS — M488X7 Other specified spondylopathies, lumbosacral region: Secondary | ICD-10-CM | POA: Diagnosis not present

## 2018-09-24 DIAGNOSIS — S32010S Wedge compression fracture of first lumbar vertebra, sequela: Secondary | ICD-10-CM | POA: Diagnosis not present

## 2018-09-24 DIAGNOSIS — M5127 Other intervertebral disc displacement, lumbosacral region: Secondary | ICD-10-CM | POA: Diagnosis not present

## 2018-09-24 DIAGNOSIS — M4726 Other spondylosis with radiculopathy, lumbar region: Secondary | ICD-10-CM | POA: Diagnosis not present

## 2018-09-24 DIAGNOSIS — M4807 Spinal stenosis, lumbosacral region: Secondary | ICD-10-CM | POA: Diagnosis not present

## 2018-09-24 DIAGNOSIS — M48061 Spinal stenosis, lumbar region without neurogenic claudication: Secondary | ICD-10-CM | POA: Diagnosis not present

## 2018-09-27 DIAGNOSIS — M5416 Radiculopathy, lumbar region: Secondary | ICD-10-CM | POA: Diagnosis not present

## 2018-09-28 DIAGNOSIS — M5416 Radiculopathy, lumbar region: Secondary | ICD-10-CM | POA: Diagnosis not present

## 2018-09-30 DIAGNOSIS — G629 Polyneuropathy, unspecified: Secondary | ICD-10-CM | POA: Diagnosis not present

## 2018-09-30 DIAGNOSIS — M5417 Radiculopathy, lumbosacral region: Secondary | ICD-10-CM | POA: Diagnosis not present

## 2018-10-11 DIAGNOSIS — Z794 Long term (current) use of insulin: Secondary | ICD-10-CM | POA: Diagnosis not present

## 2018-10-11 DIAGNOSIS — E1122 Type 2 diabetes mellitus with diabetic chronic kidney disease: Secondary | ICD-10-CM | POA: Diagnosis not present

## 2018-10-13 DIAGNOSIS — E785 Hyperlipidemia, unspecified: Secondary | ICD-10-CM | POA: Diagnosis not present

## 2018-10-13 DIAGNOSIS — E1169 Type 2 diabetes mellitus with other specified complication: Secondary | ICD-10-CM | POA: Diagnosis not present

## 2018-10-13 DIAGNOSIS — R809 Proteinuria, unspecified: Secondary | ICD-10-CM | POA: Insufficient documentation

## 2018-10-13 DIAGNOSIS — E1122 Type 2 diabetes mellitus with diabetic chronic kidney disease: Secondary | ICD-10-CM | POA: Diagnosis not present

## 2018-10-13 DIAGNOSIS — L409 Psoriasis, unspecified: Secondary | ICD-10-CM | POA: Diagnosis not present

## 2018-10-13 DIAGNOSIS — R609 Edema, unspecified: Secondary | ICD-10-CM | POA: Diagnosis not present

## 2018-10-13 DIAGNOSIS — Z794 Long term (current) use of insulin: Secondary | ICD-10-CM | POA: Diagnosis not present

## 2018-10-13 DIAGNOSIS — I10 Essential (primary) hypertension: Secondary | ICD-10-CM | POA: Diagnosis not present

## 2018-10-18 DIAGNOSIS — M5416 Radiculopathy, lumbar region: Secondary | ICD-10-CM | POA: Diagnosis not present

## 2018-10-20 DIAGNOSIS — M5416 Radiculopathy, lumbar region: Secondary | ICD-10-CM | POA: Diagnosis not present

## 2018-10-22 DIAGNOSIS — M5416 Radiculopathy, lumbar region: Secondary | ICD-10-CM | POA: Diagnosis not present

## 2018-10-25 DIAGNOSIS — M5416 Radiculopathy, lumbar region: Secondary | ICD-10-CM | POA: Diagnosis not present

## 2018-11-01 DIAGNOSIS — M5416 Radiculopathy, lumbar region: Secondary | ICD-10-CM | POA: Diagnosis not present

## 2018-11-03 DIAGNOSIS — M5416 Radiculopathy, lumbar region: Secondary | ICD-10-CM | POA: Diagnosis not present

## 2018-11-05 DIAGNOSIS — M5416 Radiculopathy, lumbar region: Secondary | ICD-10-CM | POA: Diagnosis not present

## 2019-02-09 DIAGNOSIS — R197 Diarrhea, unspecified: Secondary | ICD-10-CM | POA: Diagnosis not present

## 2019-02-09 DIAGNOSIS — R1909 Other intra-abdominal and pelvic swelling, mass and lump: Secondary | ICD-10-CM | POA: Diagnosis not present

## 2019-02-09 DIAGNOSIS — E1122 Type 2 diabetes mellitus with diabetic chronic kidney disease: Secondary | ICD-10-CM | POA: Diagnosis not present

## 2019-02-09 DIAGNOSIS — Z794 Long term (current) use of insulin: Secondary | ICD-10-CM | POA: Diagnosis not present

## 2019-02-09 DIAGNOSIS — I10 Essential (primary) hypertension: Secondary | ICD-10-CM | POA: Diagnosis not present

## 2019-02-10 DIAGNOSIS — R1909 Other intra-abdominal and pelvic swelling, mass and lump: Secondary | ICD-10-CM | POA: Diagnosis not present

## 2019-02-23 DIAGNOSIS — R739 Hyperglycemia, unspecified: Secondary | ICD-10-CM | POA: Diagnosis not present

## 2019-02-23 DIAGNOSIS — Z794 Long term (current) use of insulin: Secondary | ICD-10-CM | POA: Diagnosis not present

## 2019-02-23 DIAGNOSIS — E1122 Type 2 diabetes mellitus with diabetic chronic kidney disease: Secondary | ICD-10-CM | POA: Diagnosis not present

## 2019-02-23 DIAGNOSIS — R197 Diarrhea, unspecified: Secondary | ICD-10-CM | POA: Diagnosis not present

## 2019-02-23 DIAGNOSIS — E876 Hypokalemia: Secondary | ICD-10-CM | POA: Diagnosis not present

## 2019-02-23 DIAGNOSIS — R1909 Other intra-abdominal and pelvic swelling, mass and lump: Secondary | ICD-10-CM | POA: Diagnosis not present

## 2019-03-29 DIAGNOSIS — E1122 Type 2 diabetes mellitus with diabetic chronic kidney disease: Secondary | ICD-10-CM | POA: Diagnosis not present

## 2019-03-29 DIAGNOSIS — Z794 Long term (current) use of insulin: Secondary | ICD-10-CM | POA: Diagnosis not present

## 2019-05-24 DIAGNOSIS — E1122 Type 2 diabetes mellitus with diabetic chronic kidney disease: Secondary | ICD-10-CM | POA: Diagnosis not present

## 2019-05-24 DIAGNOSIS — Z794 Long term (current) use of insulin: Secondary | ICD-10-CM | POA: Diagnosis not present

## 2019-05-26 DIAGNOSIS — E1142 Type 2 diabetes mellitus with diabetic polyneuropathy: Secondary | ICD-10-CM | POA: Diagnosis not present

## 2019-05-26 DIAGNOSIS — Z794 Long term (current) use of insulin: Secondary | ICD-10-CM | POA: Diagnosis not present

## 2019-05-26 DIAGNOSIS — E785 Hyperlipidemia, unspecified: Secondary | ICD-10-CM | POA: Diagnosis not present

## 2019-05-26 DIAGNOSIS — M19141 Post-traumatic osteoarthritis, right hand: Secondary | ICD-10-CM | POA: Diagnosis not present

## 2019-05-26 DIAGNOSIS — M189 Osteoarthritis of first carpometacarpal joint, unspecified: Secondary | ICD-10-CM | POA: Diagnosis not present

## 2019-05-26 DIAGNOSIS — M1811 Unilateral primary osteoarthritis of first carpometacarpal joint, right hand: Secondary | ICD-10-CM | POA: Diagnosis not present

## 2019-05-26 DIAGNOSIS — E1122 Type 2 diabetes mellitus with diabetic chronic kidney disease: Secondary | ICD-10-CM | POA: Diagnosis not present

## 2019-05-26 DIAGNOSIS — Z23 Encounter for immunization: Secondary | ICD-10-CM | POA: Diagnosis not present

## 2019-05-26 DIAGNOSIS — E1169 Type 2 diabetes mellitus with other specified complication: Secondary | ICD-10-CM | POA: Diagnosis not present

## 2019-05-26 DIAGNOSIS — Z0000 Encounter for general adult medical examination without abnormal findings: Secondary | ICD-10-CM | POA: Diagnosis not present

## 2019-08-01 DIAGNOSIS — H43812 Vitreous degeneration, left eye: Secondary | ICD-10-CM | POA: Diagnosis not present

## 2019-08-15 DIAGNOSIS — L738 Other specified follicular disorders: Secondary | ICD-10-CM | POA: Diagnosis not present

## 2019-08-15 DIAGNOSIS — D171 Benign lipomatous neoplasm of skin and subcutaneous tissue of trunk: Secondary | ICD-10-CM | POA: Diagnosis not present

## 2019-08-15 DIAGNOSIS — L400 Psoriasis vulgaris: Secondary | ICD-10-CM | POA: Diagnosis not present

## 2019-08-17 DIAGNOSIS — H43812 Vitreous degeneration, left eye: Secondary | ICD-10-CM | POA: Diagnosis not present

## 2019-08-17 DIAGNOSIS — E113293 Type 2 diabetes mellitus with mild nonproliferative diabetic retinopathy without macular edema, bilateral: Secondary | ICD-10-CM | POA: Diagnosis not present

## 2019-08-17 DIAGNOSIS — H52203 Unspecified astigmatism, bilateral: Secondary | ICD-10-CM | POA: Diagnosis not present

## 2019-08-17 DIAGNOSIS — H5203 Hypermetropia, bilateral: Secondary | ICD-10-CM | POA: Diagnosis not present

## 2019-09-29 DIAGNOSIS — E7800 Pure hypercholesterolemia, unspecified: Secondary | ICD-10-CM | POA: Diagnosis not present

## 2019-09-29 DIAGNOSIS — N1831 Chronic kidney disease, stage 3a: Secondary | ICD-10-CM | POA: Diagnosis not present

## 2019-09-29 DIAGNOSIS — Z794 Long term (current) use of insulin: Secondary | ICD-10-CM | POA: Diagnosis not present

## 2019-09-29 DIAGNOSIS — I10 Essential (primary) hypertension: Secondary | ICD-10-CM | POA: Diagnosis not present

## 2019-09-29 DIAGNOSIS — E1142 Type 2 diabetes mellitus with diabetic polyneuropathy: Secondary | ICD-10-CM | POA: Diagnosis not present

## 2019-10-13 DIAGNOSIS — E1165 Type 2 diabetes mellitus with hyperglycemia: Secondary | ICD-10-CM | POA: Diagnosis not present

## 2020-01-06 DIAGNOSIS — Z23 Encounter for immunization: Secondary | ICD-10-CM | POA: Diagnosis not present

## 2020-01-16 DIAGNOSIS — R310 Gross hematuria: Secondary | ICD-10-CM | POA: Diagnosis not present

## 2020-01-16 DIAGNOSIS — N138 Other obstructive and reflux uropathy: Secondary | ICD-10-CM | POA: Diagnosis not present

## 2020-01-27 DIAGNOSIS — Z23 Encounter for immunization: Secondary | ICD-10-CM | POA: Diagnosis not present

## 2020-02-06 DIAGNOSIS — R310 Gross hematuria: Secondary | ICD-10-CM | POA: Diagnosis not present

## 2020-02-08 ENCOUNTER — Other Ambulatory Visit: Payer: Self-pay | Admitting: Urology

## 2020-02-10 DIAGNOSIS — N329 Bladder disorder, unspecified: Secondary | ICD-10-CM | POA: Diagnosis not present

## 2020-02-10 DIAGNOSIS — Z794 Long term (current) use of insulin: Secondary | ICD-10-CM | POA: Diagnosis not present

## 2020-02-10 DIAGNOSIS — Z01818 Encounter for other preprocedural examination: Secondary | ICD-10-CM | POA: Diagnosis not present

## 2020-02-10 DIAGNOSIS — E1142 Type 2 diabetes mellitus with diabetic polyneuropathy: Secondary | ICD-10-CM | POA: Diagnosis not present

## 2020-02-10 DIAGNOSIS — I10 Essential (primary) hypertension: Secondary | ICD-10-CM | POA: Diagnosis not present

## 2020-02-10 DIAGNOSIS — N183 Chronic kidney disease, stage 3 unspecified: Secondary | ICD-10-CM | POA: Diagnosis not present

## 2020-02-16 ENCOUNTER — Encounter (HOSPITAL_BASED_OUTPATIENT_CLINIC_OR_DEPARTMENT_OTHER): Payer: Self-pay | Admitting: Urology

## 2020-02-16 ENCOUNTER — Other Ambulatory Visit: Payer: Self-pay

## 2020-02-16 NOTE — Progress Notes (Signed)
Spoke w/ via phone for pre-op interview---patient Lab needs dos----   I stat 8, ekg    Covid test: 02-18-2020@1215          Arrive at -------630 am 02-22-2020 NPO after ------midnight Medications to take morning of surgery -----none Diabetic medication -----none day of surgery Patient Special Instructions -----take 1/2 dose of hs lantus solostar insulin on 02-21-2020 Pre-Op special Istructions -----none Patient verbalized understanding of instructions that were given at this phone interview. Patient denies shortness of breath, chest pain, fever, cough a this phone interview.

## 2020-02-17 ENCOUNTER — Ambulatory Visit
Admission: RE | Admit: 2020-02-17 | Discharge: 2020-02-17 | Disposition: A | Discharge status: Home / Self Care | Payer: Medicare Other | Source: Ambulatory Visit | Attending: Family Medicine | Admitting: Family Medicine

## 2020-02-17 ENCOUNTER — Other Ambulatory Visit: Payer: Self-pay | Admitting: Family Medicine

## 2020-02-17 ENCOUNTER — Other Ambulatory Visit: Payer: Self-pay

## 2020-02-17 DIAGNOSIS — I10 Essential (primary) hypertension: Secondary | ICD-10-CM | POA: Diagnosis not present

## 2020-02-17 DIAGNOSIS — Z01818 Encounter for other preprocedural examination: Secondary | ICD-10-CM

## 2020-02-18 ENCOUNTER — Other Ambulatory Visit (HOSPITAL_COMMUNITY)
Admission: RE | Admit: 2020-02-18 | Discharge: 2020-02-18 | Disposition: A | Discharge status: Home / Self Care | Payer: Medicare Other | Source: Ambulatory Visit | Attending: Urology | Admitting: Urology

## 2020-02-18 DIAGNOSIS — Z20822 Contact with and (suspected) exposure to covid-19: Secondary | ICD-10-CM | POA: Diagnosis not present

## 2020-02-18 DIAGNOSIS — Z01812 Encounter for preprocedural laboratory examination: Secondary | ICD-10-CM | POA: Insufficient documentation

## 2020-02-18 LAB — SARS CORONAVIRUS 2 (TAT 6-24 HRS): SARS Coronavirus 2: NEGATIVE

## 2020-02-21 NOTE — Anesthesia Preprocedure Evaluation (Addendum)
Anesthesia Evaluation  Patient identified by MRN, date of birth, ID band Patient awake    Reviewed: Allergy & Precautions, NPO status , Patient's Chart, lab work & pertinent test results  Airway Mallampati: II  TM Distance: >3 FB Neck ROM: Full    Dental no notable dental hx. (+) Teeth Intact, Dental Advisory Given   Pulmonary former smoker,    Pulmonary exam normal breath sounds clear to auscultation       Cardiovascular hypertension, Pt. on medications Normal cardiovascular exam Rhythm:Regular Rate:Normal  EKG NSR   Neuro/Psych negative neurological ROS  negative psych ROS   GI/Hepatic negative GI ROS, Neg liver ROS,   Endo/Other  diabetes, Insulin Dependent  Renal/GU Renal diseaseLab Results      Component                Value               Date                      CREATININE               1.80 (H)            02/22/2020                BUN                      28 (H)              02/22/2020                NA                       145                 02/22/2020                K                        3.6                 02/22/2020                CL                       105                 02/22/2020                CO2                      26                  04/11/2015               Musculoskeletal  (+) Arthritis ,   Abdominal (+) + obese,   Peds  Hematology Lab Results      Component                Value               Date                      WBC  9.2                 04/11/2015                HGB                      14.6                02/22/2020                HCT                      43.0                02/22/2020                MCV                      84.4                04/11/2015                PLT                      174                 04/11/2015              Anesthesia Other Findings Pt has an erythematous area on his L inner upper thigh with a crusted macule 2/3 way up that he  just notice pt denies it being tender . Will consult Dr Louis Meckel on whether to proceed   Reproductive/Obstetrics negative OB ROS                           Anesthesia Physical Anesthesia Plan  ASA: III  Anesthesia Plan: General   Post-op Pain Management:    Induction: Intravenous  PONV Risk Score and Plan: 3 and Treatment may vary due to age or medical condition, Ondansetron and Dexamethasone  Airway Management Planned: LMA  Additional Equipment:   Intra-op Plan:   Post-operative Plan:   Informed Consent: I have reviewed the patients History and Physical, chart, labs and discussed the procedure including the risks, benefits and alternatives for the proposed anesthesia with the patient or authorized representative who has indicated his/her understanding and acceptance.     Dental advisory given  Plan Discussed with:   Anesthesia Plan Comments: (GA w LMA)       Anesthesia Quick Evaluation

## 2020-02-22 ENCOUNTER — Observation Stay (HOSPITAL_BASED_OUTPATIENT_CLINIC_OR_DEPARTMENT_OTHER)
Admission: RE | Admit: 2020-02-22 | Discharge: 2020-02-23 | Disposition: A | Discharge status: Home / Self Care | Payer: Medicare Other | Source: Ambulatory Visit | Attending: Urology | Admitting: Urology

## 2020-02-22 ENCOUNTER — Other Ambulatory Visit: Payer: Self-pay

## 2020-02-22 ENCOUNTER — Encounter (HOSPITAL_BASED_OUTPATIENT_CLINIC_OR_DEPARTMENT_OTHER): Payer: Self-pay | Admitting: Urology

## 2020-02-22 ENCOUNTER — Ambulatory Visit (HOSPITAL_BASED_OUTPATIENT_CLINIC_OR_DEPARTMENT_OTHER): Payer: Medicare Other | Admitting: Anesthesiology

## 2020-02-22 ENCOUNTER — Encounter (HOSPITAL_BASED_OUTPATIENT_CLINIC_OR_DEPARTMENT_OTHER)
Admission: RE | Disposition: A | Discharge status: Home / Self Care | Payer: Self-pay | Source: Ambulatory Visit | Attending: Urology

## 2020-02-22 DIAGNOSIS — N400 Enlarged prostate without lower urinary tract symptoms: Secondary | ICD-10-CM | POA: Diagnosis not present

## 2020-02-22 DIAGNOSIS — Z87891 Personal history of nicotine dependence: Secondary | ICD-10-CM | POA: Insufficient documentation

## 2020-02-22 DIAGNOSIS — Z87442 Personal history of urinary calculi: Secondary | ICD-10-CM | POA: Diagnosis not present

## 2020-02-22 DIAGNOSIS — E1140 Type 2 diabetes mellitus with diabetic neuropathy, unspecified: Secondary | ICD-10-CM | POA: Insufficient documentation

## 2020-02-22 DIAGNOSIS — M1990 Unspecified osteoarthritis, unspecified site: Secondary | ICD-10-CM | POA: Diagnosis not present

## 2020-02-22 DIAGNOSIS — E7800 Pure hypercholesterolemia, unspecified: Secondary | ICD-10-CM | POA: Insufficient documentation

## 2020-02-22 DIAGNOSIS — D494 Neoplasm of unspecified behavior of bladder: Principal | ICD-10-CM | POA: Insufficient documentation

## 2020-02-22 DIAGNOSIS — R310 Gross hematuria: Secondary | ICD-10-CM | POA: Diagnosis not present

## 2020-02-22 DIAGNOSIS — N189 Chronic kidney disease, unspecified: Secondary | ICD-10-CM | POA: Diagnosis not present

## 2020-02-22 DIAGNOSIS — N138 Other obstructive and reflux uropathy: Secondary | ICD-10-CM | POA: Diagnosis not present

## 2020-02-22 DIAGNOSIS — Z79899 Other long term (current) drug therapy: Secondary | ICD-10-CM | POA: Diagnosis not present

## 2020-02-22 DIAGNOSIS — Z6834 Body mass index (BMI) 34.0-34.9, adult: Secondary | ICD-10-CM | POA: Insufficient documentation

## 2020-02-22 DIAGNOSIS — N35919 Unspecified urethral stricture, male, unspecified site: Secondary | ICD-10-CM | POA: Insufficient documentation

## 2020-02-22 DIAGNOSIS — E1122 Type 2 diabetes mellitus with diabetic chronic kidney disease: Secondary | ICD-10-CM | POA: Diagnosis not present

## 2020-02-22 DIAGNOSIS — Z8249 Family history of ischemic heart disease and other diseases of the circulatory system: Secondary | ICD-10-CM | POA: Insufficient documentation

## 2020-02-22 DIAGNOSIS — N401 Enlarged prostate with lower urinary tract symptoms: Secondary | ICD-10-CM | POA: Diagnosis not present

## 2020-02-22 DIAGNOSIS — I129 Hypertensive chronic kidney disease with stage 1 through stage 4 chronic kidney disease, or unspecified chronic kidney disease: Secondary | ICD-10-CM | POA: Diagnosis not present

## 2020-02-22 DIAGNOSIS — C672 Malignant neoplasm of lateral wall of bladder: Secondary | ICD-10-CM

## 2020-02-22 DIAGNOSIS — N529 Male erectile dysfunction, unspecified: Secondary | ICD-10-CM | POA: Insufficient documentation

## 2020-02-22 DIAGNOSIS — N35812 Other urethral bulbous stricture, male: Secondary | ICD-10-CM | POA: Diagnosis not present

## 2020-02-22 HISTORY — DX: Concussion with loss of consciousness of unspecified duration, initial encounter: S06.0X9A

## 2020-02-22 HISTORY — DX: Other chronic pain: G89.29

## 2020-02-22 HISTORY — DX: Neoplasm of unspecified behavior of bladder: D49.4

## 2020-02-22 HISTORY — DX: Personal history of urinary calculi: Z87.442

## 2020-02-22 HISTORY — PX: TRANSURETHRAL RESECTION OF BLADDER TUMOR WITH MITOMYCIN-C: SHX6459

## 2020-02-22 HISTORY — DX: Unspecified osteoarthritis, unspecified site: M19.90

## 2020-02-22 LAB — GLUCOSE, CAPILLARY
Glucose-Capillary: 140 mg/dL — ABNORMAL HIGH (ref 70–99)
Glucose-Capillary: 154 mg/dL — ABNORMAL HIGH (ref 70–99)
Glucose-Capillary: 126 mg/dL — ABNORMAL HIGH (ref 70–99)

## 2020-02-22 LAB — POCT I-STAT, CHEM 8
BUN: 28 mg/dL — ABNORMAL HIGH (ref 8–23)
Calcium, Ion: 1.26 mmol/L (ref 1.15–1.40)
Chloride: 105 mmol/L (ref 98–111)
Creatinine, Ser: 1.8 mg/dL — ABNORMAL HIGH (ref 0.61–1.24)
Glucose, Bld: 116 mg/dL — ABNORMAL HIGH (ref 70–99)
HCT: 43 % (ref 39.0–52.0)
Hemoglobin: 14.6 g/dL (ref 13.0–17.0)
Potassium: 3.6 mmol/L (ref 3.5–5.1)
Sodium: 145 mmol/L (ref 135–145)
TCO2: 27 mmol/L (ref 22–32)

## 2020-02-22 SURGERY — TRANSURETHRAL RESECTION OF BLADDER TUMOR WITH MITOMYCIN-C
Anesthesia: General | Site: Bladder | Laterality: Bilateral

## 2020-02-22 MED ORDER — KETOROLAC TROMETHAMINE 30 MG/ML IJ SOLN
INTRAMUSCULAR | Status: AC
  Filled 2020-02-22: qty 1

## 2020-02-22 MED ORDER — CEPHALEXIN 500 MG PO CAPS
500.0000 mg | ORAL_CAPSULE | Freq: Three times a day (TID) | ORAL | Status: DC
  Administered 2020-02-22 – 2020-02-23 (×2): 500 mg via ORAL
  Filled 2020-02-22: qty 2

## 2020-02-22 MED ORDER — IRBESARTAN 75 MG PO TABS
75.0000 mg | ORAL_TABLET | Freq: Every day | ORAL | Status: DC
  Administered 2020-02-22: 75 mg via ORAL
  Filled 2020-02-22: qty 1

## 2020-02-22 MED ORDER — LACTATED RINGERS IV SOLN: INTRAVENOUS | Status: DC

## 2020-02-22 MED ORDER — ZOLPIDEM TARTRATE 5 MG PO TABS: 5.0000 mg | ORAL_TABLET | Freq: Every evening | ORAL | Status: DC | PRN

## 2020-02-22 MED ORDER — ONDANSETRON HCL 4 MG/2ML IJ SOLN: 4.0000 mg | Freq: Once | INTRAMUSCULAR | Status: DC | PRN

## 2020-02-22 MED ORDER — KETOROLAC TROMETHAMINE 30 MG/ML IJ SOLN
INTRAMUSCULAR | Status: DC | PRN
Start: 2020-02-22 — End: 2020-02-22
  Administered 2020-02-22: 30 mg via INTRAVENOUS

## 2020-02-22 MED ORDER — TRAMADOL HCL 50 MG PO TABS
50.0000 mg | ORAL_TABLET | Freq: Four times a day (QID) | ORAL | Status: DC | PRN
  Administered 2020-02-22: 50 mg via ORAL

## 2020-02-22 MED ORDER — INSULIN ASPART 100 UNIT/ML ~~LOC~~ SOLN
SUBCUTANEOUS | Status: AC
  Filled 2020-02-22: qty 1

## 2020-02-22 MED ORDER — BISACODYL 10 MG RE SUPP
RECTAL | Status: AC
  Filled 2020-02-22: qty 1

## 2020-02-22 MED ORDER — BELLADONNA ALKALOIDS-OPIUM 16.2-60 MG RE SUPP
RECTAL | Status: AC
  Filled 2020-02-22: qty 1

## 2020-02-22 MED ORDER — FENTANYL CITRATE (PF) 100 MCG/2ML IJ SOLN
INTRAMUSCULAR | Status: AC
  Filled 2020-02-22: qty 2

## 2020-02-22 MED ORDER — CIPROFLOXACIN IN D5W 400 MG/200ML IV SOLN
INTRAVENOUS | Status: AC
  Filled 2020-02-22: qty 200

## 2020-02-22 MED ORDER — ACETAMINOPHEN 10 MG/ML IV SOLN: 1000.0000 mg | Freq: Once | INTRAVENOUS | Status: DC | PRN

## 2020-02-22 MED ORDER — LIDOCAINE 2% (20 MG/ML) 5 ML SYRINGE
INTRAMUSCULAR | Status: AC
  Filled 2020-02-22: qty 5

## 2020-02-22 MED ORDER — EPHEDRINE 5 MG/ML INJ
INTRAVENOUS | Status: AC
  Filled 2020-02-22: qty 10

## 2020-02-22 MED ORDER — GEMCITABINE CHEMO FOR BLADDER INSTILLATION 2000 MG
2000.0000 mg | Freq: Once | INTRAVENOUS | Status: DC
  Filled 2020-02-22: qty 52.6

## 2020-02-22 MED ORDER — FUROSEMIDE 40 MG PO TABS
40.0000 mg | ORAL_TABLET | Freq: Every day | ORAL | Status: DC
  Filled 2020-02-22: qty 1

## 2020-02-22 MED ORDER — EPHEDRINE SULFATE-NACL 50-0.9 MG/10ML-% IV SOSY
PREFILLED_SYRINGE | INTRAVENOUS | Status: DC | PRN
  Administered 2020-02-22: 10 mg via INTRAVENOUS

## 2020-02-22 MED ORDER — INSULIN ASPART 100 UNIT/ML ~~LOC~~ SOLN
0.0000 [IU] | SUBCUTANEOUS | Status: DC
  Administered 2020-02-22: 4 [IU] via SUBCUTANEOUS

## 2020-02-22 MED ORDER — SODIUM CHLORIDE 0.9 % IR SOLN
Status: DC | PRN
  Administered 2020-02-22: 12000 mL via INTRAVESICAL

## 2020-02-22 MED ORDER — SODIUM CHLORIDE 0.45 % IV SOLN: INTRAVENOUS | Status: DC

## 2020-02-22 MED ORDER — ONDANSETRON HCL 4 MG/2ML IJ SOLN
INTRAMUSCULAR | Status: AC
  Filled 2020-02-22: qty 2

## 2020-02-22 MED ORDER — POTASSIUM CHLORIDE CRYS ER 20 MEQ PO TBCR
20.0000 meq | EXTENDED_RELEASE_TABLET | Freq: Every day | ORAL | Status: DC
  Administered 2020-02-22: 20 meq via ORAL
  Filled 2020-02-22: qty 1

## 2020-02-22 MED ORDER — IOHEXOL 300 MG/ML  SOLN
INTRAMUSCULAR | Status: DC | PRN
  Administered 2020-02-22: 10 mL via URETHRAL

## 2020-02-22 MED ORDER — CIPROFLOXACIN IN D5W 400 MG/200ML IV SOLN
400.0000 mg | INTRAVENOUS | Status: AC
  Administered 2020-02-22: 400 mg via INTRAVENOUS

## 2020-02-22 MED ORDER — CEPHALEXIN 500 MG PO CAPS: 500.0000 mg | ORAL_CAPSULE | Freq: Three times a day (TID) | ORAL | 0 refills | Status: AC

## 2020-02-22 MED ORDER — BELLADONNA ALKALOIDS-OPIUM 16.2-60 MG RE SUPP: 1.0000 | Freq: Three times a day (TID) | RECTAL | Status: DC | PRN

## 2020-02-22 MED ORDER — TRAMADOL HCL 50 MG PO TABS: 50.0000 mg | ORAL_TABLET | Freq: Four times a day (QID) | ORAL | 0 refills | Status: DC | PRN

## 2020-02-22 MED ORDER — TRAMADOL HCL 50 MG PO TABS
ORAL_TABLET | ORAL | Status: AC
  Filled 2020-02-22: qty 1

## 2020-02-22 MED ORDER — LIDOCAINE 2% (20 MG/ML) 5 ML SYRINGE
INTRAMUSCULAR | Status: DC | PRN
  Administered 2020-02-22: 60 mg via INTRAVENOUS

## 2020-02-22 MED ORDER — ONDANSETRON HCL 4 MG/2ML IJ SOLN
INTRAMUSCULAR | Status: DC | PRN
  Administered 2020-02-22: 4 mg via INTRAVENOUS

## 2020-02-22 MED ORDER — BISACODYL 10 MG RE SUPP
10.0000 mg | Freq: Every day | RECTAL | Status: DC | PRN
  Administered 2020-02-22: 10 mg via RECTAL

## 2020-02-22 MED ORDER — BELLADONNA ALKALOIDS-OPIUM 16.2-60 MG RE SUPP
RECTAL | Status: DC | PRN
  Administered 2020-02-22: 1 via RECTAL

## 2020-02-22 MED ORDER — FENTANYL CITRATE (PF) 100 MCG/2ML IJ SOLN
INTRAMUSCULAR | Status: DC | PRN
  Administered 2020-02-22 (×6): 25 ug via INTRAVENOUS

## 2020-02-22 MED ORDER — FENTANYL CITRATE (PF) 100 MCG/2ML IJ SOLN: 25.0000 ug | INTRAMUSCULAR | Status: DC | PRN

## 2020-02-22 MED ORDER — PROPOFOL 10 MG/ML IV BOLUS
INTRAVENOUS | Status: AC
  Filled 2020-02-22: qty 40

## 2020-02-22 MED ORDER — PHENAZOPYRIDINE HCL 200 MG PO TABS
200.0000 mg | ORAL_TABLET | Freq: Three times a day (TID) | ORAL | 0 refills | Status: DC | PRN
Start: 2020-02-22 — End: 2020-02-23

## 2020-02-22 MED ORDER — PROPOFOL 10 MG/ML IV BOLUS
INTRAVENOUS | Status: DC | PRN
  Administered 2020-02-22: 100 mg via INTRAVENOUS

## 2020-02-22 SURGICAL SUPPLY — 31 items
BAG DRAIN URO-CYSTO SKYTR STRL (DRAIN) ×3 IMPLANT
BAG URINE DRAIN 2000ML AR STRL (UROLOGICAL SUPPLIES) ×3 IMPLANT
BALLN NEPHROSTOMY (BALLOONS) ×3
BALLOON NEPHROSTOMY (BALLOONS) ×1 IMPLANT
Bard X- Force nephrostomy ballon dilation catheter ×3 IMPLANT
CATH DILATION NEPHR BALL 15X10 (CATHETERS) ×3 IMPLANT
CATH FOLEY 2W COUNCIL 20FR 5CC (CATHETERS) ×3 IMPLANT
CATH FOLEY 3WAY 30CC 22FR (CATHETERS) IMPLANT
CLOTH BEACON ORANGE TIMEOUT ST (SAFETY) ×3 IMPLANT
ELECT REM PT RETURN 9FT ADLT (ELECTROSURGICAL) ×3
ELECTRODE REM PT RTRN 9FT ADLT (ELECTROSURGICAL) ×1 IMPLANT
EVACUATOR MICROVAS BLADDER (UROLOGICAL SUPPLIES) IMPLANT
GAUZE SPONGE 4X4 12PLY STRL (GAUZE/BANDAGES/DRESSINGS) ×3 IMPLANT
GLOVE BIO SURGEON STRL SZ7.5 (GLOVE) ×9 IMPLANT
GLOVE BIOGEL PI IND STRL 6.5 (GLOVE) ×1 IMPLANT
GLOVE BIOGEL PI INDICATOR 6.5 (GLOVE) ×2
GLOVE ECLIPSE 6.5 STRL STRAW (GLOVE) ×3 IMPLANT
GOWN STRL REUS W/ TWL XL LVL3 (GOWN DISPOSABLE) ×2 IMPLANT
GOWN STRL REUS W/TWL LRG LVL4 (GOWN DISPOSABLE) ×3 IMPLANT
GOWN STRL REUS W/TWL XL LVL3 (GOWN DISPOSABLE) ×4
GUIDEWIRE STR DUAL SENSOR (WIRE) ×3 IMPLANT
HOLDER FOLEY CATH W/STRAP (MISCELLANEOUS) IMPLANT
IV NS IRRIG 3000ML ARTHROMATIC (IV SOLUTION) ×12 IMPLANT
KIT TURNOVER CYSTO (KITS) ×3 IMPLANT
LOOP CUT BIPOLAR 24F LRG (ELECTROSURGICAL) ×3 IMPLANT
MANIFOLD NEPTUNE II (INSTRUMENTS) ×3 IMPLANT
SYR 30ML LL (SYRINGE) ×3 IMPLANT
TRAY CYSTO PACK (CUSTOM PROCEDURE TRAY) ×3 IMPLANT
TUBE CONNECTING 12'X1/4 (SUCTIONS) ×1
TUBE CONNECTING 12X1/4 (SUCTIONS) ×2 IMPLANT
WATER STERILE IRR 500ML POUR (IV SOLUTION) ×3 IMPLANT

## 2020-02-22 NOTE — Anesthesia Postprocedure Evaluation (Signed)
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  AND URETHRAL DIALATION WITH BLADDER NECK INCSION (Bilateral Bladder)     Patient location during evaluation: PACU Anesthesia Type: General Level of consciousness: awake and alert Pain management: pain level controlled Vital Signs Assessment: post-procedure vital signs reviewed and stable Respiratory status: spontaneous breathing, nonlabored ventilation, respiratory function stable and patient connected to nasal cannula oxygen Cardiovascular status: blood pressure returned to baseline and stable Postop Assessment: no apparent nausea or vomiting Anesthetic complications: no    Last Vitals:  Vitals:   02/22/20 1100 02/22/20 1115  BP: 132/74 126/64  Pulse: 88 81  Resp: (!) 24 18  Temp:    SpO2: 98% 95%    Last Pain:  Vitals:   02/22/20 1345  TempSrc:   PainSc: 0-No pain                 Barnet Glasgow

## 2020-02-22 NOTE — Interval H&P Note (Signed)
History and Physical Interval Note:  02/22/2020 8:35 AM  George Morgan  has presented today for surgery, with the diagnosis of BLADDER TUMOR.  The various methods of treatment have been discussed with the patient and family. 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  The patient's history has been reviewed, patient examined, no change in status, stable for surgery.  I have reviewed the patient's chart and labs.  Questions were answered to the patient's satisfaction.     Ardis Hughs

## 2020-02-22 NOTE — Transfer of Care (Signed)
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  AND URETHRAL DIALATION WITH BLADDER NECK INCSION (Bilateral)  Patient Location: PACU  Anesthesia Type: General  Level of Consciousness: awake, oriented, sedated and patient cooperative  Airway & Oxygen Therapy: Patient Spontanous Breathing and Patient connected to face mask oxygen  Post-op Assessment: Report given to PACU RN and Post -op Vital signs reviewed and stable  Post vital signs: Reviewed and stable  Complications: No apparent anesthesia complications Last Vitals:  Vitals Value Taken Time  BP 132/74 02/22/20 1100  Temp 36.6 C 02/22/20 1043  Pulse 88 02/22/20 1102  Resp 16 02/22/20 1102  SpO2 98 % 02/22/20 1102  Vitals shown include unvalidated device data.  Last Pain:  Vitals:   02/22/20 1043  TempSrc:   PainSc: Asleep

## 2020-02-22 NOTE — Op Note (Signed)
Preoperative diagnosis:  1. Bladder tumor  Postoperative diagnosis:  1. Bladder tumor, right posterior lateral wall, 2 cm 2. Urethral stricture 3. Benign prostatic hyperplasia with obstruction  Procedure: 1. Cystoscopy, bilateral retrograde pyelogram with interpretation 2. Urethral dilation with balloon 3. Transurethral resection of bladder tumor, 2 cm 4. Transurethral incision of bladder neck  Surgeon: Ardis Hughs, MD  Anesthesia: General  Complications: None  Intraoperative findings:  #1: The patient's urethra was very unaccommodating in the bulb, there was no discrete narrowing or stricture, but the entire urethra was narrow. #2: The retrograde pyelograms demonstrated bilateral J hooking of the UVJ secondary to impingement of his prostate, but otherwise normal caliber ureters with no filling defects or abnormalities. #3: The tumor was located in the right lateral posterior wall and was completely resected, it appeared low-grade and nonmuscle invasive.  The patient had a high median bar with an obstructive intravesical median lobe. #4: The patient's urethra was very stiff and difficult to dilate.  I was unable to get a 21 French cystoscope through it initially.  I did use the [employer] sounds and unfortunately falls passed the patient in the bulbous urethra.  However it did dilate things enough so that I could get my cystoscope into the urethra so that I could perform cystoscopy in the retrograde pyelograms.  I then used a 24 [place] urethral balloon dilator, but this was not enough to allow me to get the 26 French resectoscope sheath through the urethra.  I try the 24 French laser sheath which was not long enough and could not get this beyond the patient's prostate.  Thus, I ended up using a 22 [place] NephroMax PCNL balloon dilator which ultimately allowed me to get the 26 French resectoscope sheath into the patient's urethra.  EBL: 150 mL  Specimens:  #1: Bladder tumor #2:  Bladder neck/prostate chips  Indication: George Morgan is a 79 y.o. patient with gross hematuria who was ultimately found to have a small bladder tumor.  After reviewing the management options for treatment, he elected to proceed with the above surgical procedure(s). We have discussed the potential benefits and risks of the procedure, side effects of the proposed treatment, the likelihood of the patient achieving the goals of the procedure, and any potential problems that might occur during the procedure or recuperation. Informed consent has been obtained.  Description of procedure:  The patient was taken to the operating room and general anesthesia was induced.  The patient was placed in the dorsal lithotomy position, prepped and draped in the usual sterile fashion, and preoperative antibiotics were administered. A preoperative time-out was performed.   I initially attempted to pass a 21 French 0 degree cystoscope through the patient's urethra and into the bladder.  I was unable to get the scope past the fossa navicularis.  As such I dilated the patient's urethral meatus and advanced the scope into the bulbous urethra and was unable to advance beyond this.  At this point I opted to pass [employer].  I passed up to 58 [place] and then reevaluated the urethra.  I noticed that then a false passage in the posterior bulbous urethra.  However, did dilate more distally and allow me to advance the 102 [place] scope into the patient's urethra.  Once into the bladder I performed 360 degrees cystoscopic evaluation with the above findings.  I then performed retrograde pyelograms using 10 cc of Omnipaque contrast per ureter through 5 French open-ended catheter with the above findings.  I then removed the 21 French sheath and tried to advance a 26 [place] sheath.  I was unable to get it beyond the anterior urethra and as such advanced a wire through the cystoscope again and got the wire into the patient's bladder.   I then used a 24 French balloon dilator as this was the only size balloon dilator and the entire operating room.  I dilated up in the routine fashion for 90 seconds and then deflated the balloon and remove the dilator.  I was unable to get the 26 French resectoscope sheath and again and opted to try the 24 [place] resectoscope sheath used primarily for the greenlight laser.  I was unable to get this sheath past the posterior urethra and prostate.  At this point I repassed the wire and opted to dilate the patient's urethra with a 26 French NephroMax PCNL balloon.  Initially I cautiously cranked the pressure to approximately 6 mmHg, and then tried to pass the 26 [place] sheath again unsuccessfully.  Finally I repassed the balloon and dilated to 30 [place] with 16 mmHg holding it in place for 90 seconds.  I then removed the balloon dilator and was ultimately able to advance the 26 French resectoscope sheath into the patient's bladder.  Fulgurated areas along the bladder neck and prostate prior to resecting the patient's bladder tumor in the routine fashion on the right posterior lateral wall.  Hemostasis was then achieved and the bladder tumor removed.  Then pulling back to the patient's prostate I ultimately decided to perform a transurethral incision of the bladder neck given the obstructive nature.  I did this by using the resectoscope sheath with the loop element and resected the posterior aspect of the bladder neck and prostate.  These specimens were sent separately.  Again hemostasis was achieved the bladder was left full and a wire passed through the scope.  I then remove the scope and exchanged for a 20 [place] council tip Foley catheter.  This was passed atraumatically over a wire with 10 cc of sterile water inflated into the patient's Foley balloon.  BNO suppositories placed in the patient's rectum he was subsequently awoken and returned the PACU in stable condition.  Ardis Hughs, M.D.

## 2020-02-22 NOTE — Anesthesia Procedure Notes (Signed)
Procedure Name: LMA Insertion Date/Time: 02/22/2020 8:49 AM Performed by: Suan Halter, CRNA Pre-anesthesia Checklist: Patient identified, Emergency Drugs available, Suction available and Patient being monitored Patient Re-evaluated:Patient Re-evaluated prior to induction Oxygen Delivery Method: Circle system utilized Preoxygenation: Pre-oxygenation with 100% oxygen Induction Type: IV induction Ventilation: Mask ventilation without difficulty LMA: LMA inserted LMA Size: 4.0 Number of attempts: 1 Airway Equipment and Method: Bite block Placement Confirmation: positive ETCO2 Tube secured with: Tape Dental Injury: Teeth and Oropharynx as per pre-operative assessment

## 2020-02-22 NOTE — H&P (Signed)
gross hematuria evaluation  HPI: George Morgan is a 79 year-old male established patient who is here for further evaluation of gross hematuria.  He first noticed the blood approximately 01/11/2020. He has noticed blood a few times.   The patient has had an abdominal ct scan within the last year. He has not been told that he has blood in his urine prior to this episode.   The patient has had progression of his voiding symptoms over the last 6 months including urgency. The patient denies any new or recent onset of back/flank pain or suprapubic pain.   The patient does not have a history of recurrent UTIs. They have a history of kidney stones. He has not been exposed to occupational hazards that may increase their risks for developing cancer. The patient has no family history of GU malignancy. The patient is a former smoker.   The patient noted the hematospermia about 3 weeks ago. Two weeks later he also passed a small amorphous dark specimen. He denies any dysuria. He denies any worsening frequency or urgency. He denies any flank pain. He does have a history of kidney stones.   Interval: Today the patient presents with a CT scan that was performed in [place] last year. I reviewed the CT scan which shows no stones or upper tract abnormalities. It is a noncontrast CT scan. The patient has not had any ongoing bleeding. He does not have any urinary tract symptoms to speak of.     ALLERGIES: None   MEDICATIONS: Atorvastatin Calcium 40 mg tablet  Furosemide 40 mg tablet  Humalog  Humalog Kwikpen U-100 100 unit/ml insulin pen  Lantus Solostar 100 unit/ml (3 ml) insulin pen  Valsartan 80 mg tablet     GU PSH: None   NON-GU PSH: Carpal Tunnel Surgery.., Left Cataract Surgery.. Rotator Cuff Surgery.., Right Tonsillectomy..     GU PMH: Gross hematuria - 01/16/2020 Encounter for Prostate Cancer screening - 2018 Urinary Obstruction - 2018 Elevated PSA - 2017    NON-GU PMH:  Arthritis Diabetes Type 2 Hypercholesterolemia Hypertension Other psoriasis Skin Cancer, History    FAMILY HISTORY: 1 Daughter - Daughter 2 sons - Son Acute CVA (cerebrovascular accident) - Father, Mother Acute Myocardial Infarction - Mother, Father Death of family member - Father, Mother Kidney Stones - Father   SOCIAL HISTORY: Marital Status: Married Preferred Language: English; Ethnicity: Not Hispanic Or Latino; Race: White Current Smoking Status: Unknown if patient smokes.  Has never drank.  Drinks 1 caffeinated drink per day.    REVIEW OF SYSTEMS:    GU Review Male:   Patient denies frequent urination, hard to postpone urination, burning/ pain with urination, get up at night to urinate, leakage of urine, stream starts and stops, trouble starting your stream, have to strain to urinate , erection problems, and penile pain.  Gastrointestinal (Upper):   Patient denies nausea, vomiting, and indigestion/ heartburn.  Gastrointestinal (Lower):   Patient denies diarrhea and constipation.  Constitutional:   Patient denies fever, night sweats, weight loss, and fatigue.  Skin:   Patient denies itching and skin rash/ lesion.  Eyes:   Patient denies blurred vision and double vision.  Ears/ Nose/ Throat:   Patient denies sore throat and sinus problems.  Hematologic/Lymphatic:   Patient denies swollen glands and easy bruising.  Cardiovascular:   Patient reports leg swelling. Patient denies chest pains.  Respiratory:   Patient denies cough and shortness of breath.  Endocrine:   Patient denies excessive thirst.  Musculoskeletal:  Patient reports back pain. Patient denies joint pain.  Neurological:   Patient denies headaches and dizziness.  Psychologic:   Patient denies depression and anxiety.   Notes: Pt c/o blood clot in urine.    VITAL SIGNS:      02/06/2020 02:58 PM  Weight 220 lb / 99.79 kg  Height 67 in / 170.18 cm  BP 146/77 mmHg  Heart Rate 70 /min  Temperature 97.8 F / 36.5  C  BMI 34.5 kg/m   Complexity of Data:  Records Review:   Previous Doctor Records, Previous Patient Records, POC Tool  Urine Test Review:   Urinalysis  X-Ray Review: C.T. Abdomen/Pelvis: Reviewed Films. Discussed With Patient.     07/31/17  PSA  Total PSA 3.41 ng/mL    PROCEDURES:         Flexible Cystoscopy - 52000  Risks, benefits, and some of the potential complications of the procedure were discussed at length with the patient including infection, bleeding, voiding discomfort, urinary retention, fever, chills, sepsis, and others. All questions were answered. Informed consent was obtained. Sterile technique and intraurethral analgesia were used.  Meatus:  Normal size. Normal location. Normal condition.  Urethra:  No strictures.  External Sphincter:  Normal.  Verumontanum:  Normal.  Prostate:  Non-obstructing. No hyperplasia.  Bladder Neck:  Non-obstructing.  Ureteral Orifices:  Normal location. Normal size. Normal shape. Effluxed clear urine.  Bladder:  Mild trabeculation. A trigone tumor. 1 cm tumor. Normal mucosa. No stones.      The lower urinary tract was carefully examined. The procedure was well-tolerated and without complications. Antibiotic instructions were given. Instructions were given to call the office immediately for bloody urine, difficulty urinating, urinary retention, painful or frequent urination, fever, chills, nausea, vomiting or other illness. The patient stated that he understood these instructions and would comply with them.         Urinalysis w/Scope Dipstick Dipstick Cont'd Micro  Color: Amber Bilirubin: Neg mg/dL WBC/hpf: 0 - 5/hpf  Appearance: Clear Ketones: Neg mg/dL RBC/hpf: 3 - 10/hpf  Specific Gravity: 1.025 Blood: Neg ery/uL Bacteria: Rare (0-9/hpf)  pH: 5.5 Protein: 1+ mg/dL Cystals: NS (Not Seen)  Glucose: Neg mg/dL Urobilinogen: 1.0 mg/dL Casts: NS (Not Seen)    Nitrites: Neg Trichomonas: Not Present    Leukocyte Esterase: Neg leu/uL Mucous:  Present      Epithelial Cells: 0 - 5/hpf      Yeast: NS (Not Seen)      Sperm: Not Present    ASSESSMENT:      ICD-10 Details  1 GU:   Gross hematuria - R31.0      PLAN:           Document Letter(s):  Created for Patient: Clinical Summary         Notes:   The patient has a small papillary lesion in the trigone of his bladder. This appears to be a low-grade superficial bladder cancer. I recommended that we proceed to the operating room for a TURBT, bilateral retrograde pyelograms, and postoperative installation of intravesical gemcitabine. I went through the procedure as well as the rationale for the chemotherapy postoperatively. Having gone through all this and answered all the questions the patient would like to proceed. Will try to get this scheduled at his convenience.

## 2020-02-23 ENCOUNTER — Encounter (HOSPITAL_BASED_OUTPATIENT_CLINIC_OR_DEPARTMENT_OTHER): Payer: Self-pay | Admitting: Urology

## 2020-02-23 DIAGNOSIS — D494 Neoplasm of unspecified behavior of bladder: Secondary | ICD-10-CM | POA: Diagnosis not present

## 2020-02-23 LAB — SURGICAL PATHOLOGY

## 2020-02-23 MED ORDER — PHENAZOPYRIDINE HCL 200 MG PO TABS
200.0000 mg | ORAL_TABLET | Freq: Three times a day (TID) | ORAL | 0 refills | Status: DC | PRN
Start: 2020-02-23 — End: 2021-07-29

## 2020-02-23 NOTE — Discharge Instructions (Signed)
Transurethral Resection of Bladder Tumor (TURBT) or Bladder Biopsy Definition:  Transurethral Resection of the Bladder Tumor is a surgical procedure used to diagnose and remove tumors within the bladder. TURBT is the most common treatment for early stage bladder cancer.  General instructions:    Your recent bladder surgery requires very little post hospital care but some definite precautions.  Despite the fact that no skin incisions were used, the area around the bladder incisions are raw and covered with scabs to promote healing and prevent bleeding. Certain precautions are needed to insure that the scabs are not disturbed over the next 2-4 weeks while the healing proceeds.  Because the raw surface inside your bladder and the irritating effects of urine you may expect frequency of urination and/or urgency (a stronger desire to urinate) and perhaps even getting up at night more often. This will usually resolve or improve slowly over the healing period. You may see some blood in your urine over the first 6 weeks. Do not be alarmed, even if the urine was clear for a while. Get off your feet and drink lots of fluids until clearing occurs. If you start to pass clots or don't improve call us.  Diet:  You may return to your normal diet immediately. Because of the raw surface of your bladder, alcohol, spicy foods, foods high in acid and drinks with caffeine may cause irritation or frequency and should be used in moderation. To keep your urine flowing freely and avoid constipation, drink plenty of fluids during the day (8-10 glasses). Tip: Avoid cranberry juice because it is very acidic.  Activity:  Your physical activity doesn't need to be restricted. However, if you are very active, you may see some blood in the urine. We suggest that you reduce your activity under the circumstances until the bleeding has stopped.  Bowels:  It is important to keep your bowels regular during the postoperative period.  Straining with bowel movements can cause bleeding. A bowel movement every other day is reasonable. Use a mild laxative if needed, such as milk of magnesia 2-3 tablespoons, or 2 Dulcolax tablets. Call if you continue to have problems. If you had been taking narcotics for pain, before, during or after your surgery, you may be constipated. Take a laxative if necessary.  Medication:  You should resume your pre-surgery medications unless told not to. In addition you may be given an antibiotic to prevent or treat infection. Antibiotics are not always necessary. All medication should be taken as prescribed until the bottles are finished unless you are having an unusual reaction to one of the drugs.  Post Anesthesia Home Care Instructions  Activity: Get plenty of rest for the remainder of the day. A responsible individual must stay with you for 24 hours following the procedure.  For the next 24 hours, DO NOT: -Drive a car -Paediatric nurse -Drink alcoholic beverages -Take any medication unless instructed by your physician -Make any legal decisions or sign important papers.  Meals: Start with liquid foods such as gelatin or soup. Progress to regular foods as tolerated. Avoid greasy, spicy, heavy foods. If nausea and/or vomiting occur, drink only clear liquids until the nausea and/or vomiting subsides. Call your physician if vomiting continues.  Special Instructions/Symptoms: Your throat may feel dry or sore from the anesthesia or the breathing tube placed in your throat during surgery. If this causes discomfort, gargle with warm salt water. The discomfort should disappear within 24 hours.  Foley Catheter Care A soft, flexible tube (  Foley catheter) may have been placed in your bladder to drain urine and fluid. Follow these instructions: Taking Care of the Catheter  Keep the area where the catheter leaves your body clean.   Attach the catheter to the leg so there is no tension on the catheter.    Keep the drainage bag below the level of the bladder, but keep it OFF the floor.   Do not take long soaking baths. Your caregiver will give instructions about showering.   Wash your hands before touching ANYTHING related to the catheter or bag.   Using mild soap and warm water on a washcloth:   Clean the area closest to the catheter insertion site using a circular motion around the catheter.   Clean the catheter itself by wiping AWAY from the insertion site for several inches down the tube.   NEVER wipe upward as this could sweep bacteria up into the urethra (tube in your body that normally drains the bladder) and cause infection.   Place a small amount of sterile lubricant at the tip of the penis where the catheter is entering.  Taking Care of the Drainage Bags  Two drainage bags may be taken home: a large overnight drainage bag, and a smaller leg bag which fits underneath clothing.   It is okay to wear the overnight bag at any time, but NEVER wear the smaller leg bag at night.   Keep the drainage bag well below the level of your bladder. This prevents backflow of urine into the bladder and allows the urine to drain freely.   Anchor the tubing to your leg to prevent pulling or tension on the catheter. Use tape or a leg strap provided by the hospital.   Empty the drainage bag when it is 1/2 to 3/4 full. Wash your hands before and after touching the bag.   Periodically check the tubing for kinks to make sure there is no pressure on the tubing which could restrict the flow of urine.  Changing the Drainage Bags  Cleanse both ends of the clean bag with alcohol before changing.   Pinch off the rubber catheter to avoid urine spillage during the disconnection.   Disconnect the dirty bag and connect the clean one.   Empty the dirty bag carefully to avoid a urine spill.   Attach the new bag to the leg with tape or a leg strap.  Cleaning the Drainage Bags  Whenever a drainage bag  is disconnected, it must be cleaned quickly so it is ready for the next use.   Wash the bag in warm, soapy water.   Rinse the bag thoroughly with warm water.   Soak the bag for 30 minutes in a solution of white vinegar and water (1 cup vinegar to 1 quart warm water).   Rinse with warm water.  SEEK MEDICAL CARE IF:   You have chills or night sweats.   You are leaking around your catheter or have problems with your catheter. It is not uncommon to have sporadic leakage around your catheter as a result of bladder spasms. If the leakage stops, there is not much need for concern. If you are uncertain, call your caregiver.   You develop side effects that you think are coming from your medicines.  SEEK IMMEDIATE MEDICAL CARE IF:   You are suddenly unable to urinate. Check to see if there are any kinks in the drainage tubing that may cause this. If you cannot find any kinks, call your  caregiver immediately. This is an emergency.   You develop shortness of breath or chest pains.   Bleeding persists or clots develop in your urine.   You have a fever.   You develop pain in your back or over your lower belly (abdomen).   You develop pain or swelling in your legs.   Any problems you are having get worse rather than better.  MAKE SURE YOU:   Understand these instructions.   Will watch your condition.   Will get help right away if you are not doing well or get worse.     Indwelling Urinary Catheter Care, Adult An indwelling urinary catheter is a thin tube that is put into your bladder. The tube helps to drain pee (urine) out of your body. The tube goes in through your urethra. Your urethra is where pee comes out of your body. Your pee will come out through the catheter, then it will go into a bag (drainage bag). Take good care of your catheter so it will work well. How to wear your catheter and bag Supplies needed  Sticky tape (adhesive tape) or a leg strap.  Alcohol wipe or soap and  water (if you use tape).  A clean towel (if you use tape).  Large overnight bag.  Smaller bag (leg bag). Wearing your catheter Attach your catheter to your leg with tape or a leg strap.  Make sure the catheter is not pulled tight.  If a leg strap gets wet, take it off and put on a dry strap.  If you use tape to hold the bag on your leg: 1. Use an alcohol wipe or soap and water to wash your skin where the tape made it sticky before. 2. Use a clean towel to pat-dry that skin. 3. Use new tape to make the bag stay on your leg. Wearing your bags You should have been given a large overnight bag.  You may wear the overnight bag in the day or night.  Always have the overnight bag lower than your bladder.  Do not let the bag touch the floor.  Before you go to sleep, put a clean plastic bag in a wastebasket. Then hang the overnight bag inside the wastebasket. You should also have a smaller leg bag that fits under your clothes.  Always wear the leg bag below your knee.  Do not wear your leg bag at night. How to care for your skin and catheter Supplies needed  A clean washcloth.  Water and mild soap.  A clean towel. Caring for your skin and catheter      Clean the skin around your catheter every day: 1. Wash your hands with soap and water. 2. Wet a clean washcloth in warm water and mild soap. 3. Clean the skin around your urethra.  If you are male:  Gently spread the folds of skin around your vagina (labia).  With the washcloth in your other hand, wipe the inner side of your labia on each side. Wipe from front to back.  If you are male:  Pull back any skin that covers the end of your penis (foreskin).  With the washcloth in your other hand, wipe your penis in small circles. Start wiping at the tip of your penis, then move away from the catheter.  Move the foreskin back in place, if needed. 4. With your free hand, hold the catheter close to where it goes into your  body.  Keep holding the catheter during cleaning  so it does not get pulled out. 5. With the washcloth in your other hand, clean the catheter.  Only wipe downward on the catheter.  Do not wipe upward toward your body. Doing this may push germs into your urethra and cause infection. 6. Use a clean towel to pat-dry the catheter and the skin around it. Make sure to wipe off all soap. 7. Wash your hands with soap and water.  Shower every day. Do not take baths.  Do not use cream, ointment, or lotion on the area where the catheter goes into your body, unless your doctor tells you to.  Do not use powders, sprays, or lotions on your genital area.  Check your skin around the catheter every day for signs of infection. Check for: ? Redness, swelling, or pain. ? Fluid or blood. ? Warmth. ? Pus or a bad smell. How to empty the bag Supplies needed  Rubbing alcohol.  Gauze pad or cotton ball.  Tape or a leg strap. Emptying the bag Pour the pee out of your bag when it is ?- full, or at least 2-3 times a day. Do this for your overnight bag and your leg bag. 1. Wash your hands with soap and water. 2. Separate (detach) the bag from your leg. 3. Hold the bag over the toilet or a clean pail. Keep the bag lower than your hips and bladder. This is so the pee (urine) does not go back into the tube. 4. Open the pour spout. It is at the bottom of the bag. 5. Empty the pee into the toilet or pail. Do not let the pour spout touch any surface. 6. Put rubbing alcohol on a gauze pad or cotton ball. 7. Use the gauze pad or cotton ball to clean the pour spout. 8. Close the pour spout. 9. Attach the bag to your leg with tape or a leg strap. 10. Wash your hands with soap and water. Follow instructions for cleaning the drainage bag:  From the product maker.  As told by your doctor. How to change the bag Supplies needed  Alcohol wipes.  A clean bag.  Tape or a leg strap. Changing the bag Replace  your bag when it starts to leak, smell bad, or look dirty. 1. Wash your hands with soap and water. 2. Separate the dirty bag from your leg. 3. Pinch the catheter with your fingers so that pee does not spill out. 4. Separate the catheter tube from the bag tube where these tubes connect (at the connection valve). Do not let the tubes touch any surface. 5. Clean the end of the catheter tube with an alcohol wipe. Use a different alcohol wipe to clean the end of the bag tube. 6. Connect the catheter tube to the tube of the clean bag. 7. Attach the clean bag to your leg with tape or a leg strap. Do not make the bag tight on your leg. 8. Wash your hands with soap and water. General rules   Never pull on your catheter. Never try to take it out. Doing that can hurt you.  Always wash your hands before and after you touch your catheter or bag. Use a mild, fragrance-free soap. If you do not have soap and water, use hand sanitizer.  Always make sure there are no twists or bends (kinks) in the catheter tube.  Always make sure there are no leaks in the catheter or bag.  Drink enough fluid to keep your pee pale yellow.  Do not take baths, swim, or use a hot tub.  If you are male, wipe from front to back after you poop (have a bowel movement). Contact a doctor if:  Your pee is cloudy.  Your pee smells worse than usual.  Your catheter gets clogged.  Your catheter leaks.  Your bladder feels full. Get help right away if:  You have redness, swelling, or pain where the catheter goes into your body.  You have fluid, blood, pus, or a bad smell coming from the area where the catheter goes into your body.  Your skin feels warm where the catheter goes into your body.  You have a fever.  You have pain in your: ? Belly (abdomen). ? Legs. ? Lower back. ? Bladder.  You see blood in the catheter.  Your pee is pink or red.  You feel sick to your stomach (nauseous).  You throw up  (vomit).  You have chills.  Your pee is not draining into the bag.  Your catheter gets pulled out. Summary  An indwelling urinary catheter is a thin tube that is placed into the bladder to help drain pee (urine) out of the body.  The catheter is placed into the part of the body that drains pee from the bladder (urethra).  Taking good care of your catheter will keep it working properly and help prevent problems.  Always wash your hands before and after touching your catheter or bag.  Never pull on your catheter or try to take it out. This information is not intended to replace advice given to you by your health care provider. Make sure you discuss any questions you have with your health care provider. Document Revised: 12/24/2018 Document Reviewed: 04/17/2017 Elsevier Patient Education  [place].

## 2020-02-23 NOTE — Discharge Summary (Signed)
Date of admission: 02/22/2020  Date of discharge: 02/23/2020  Admission diagnosis: bladder tumor  Discharge diagnosis: same, gross hematuria, urethral stricture  Secondary diagnoses:  Patient Active Problem List   Diagnosis Date Noted  . Gross hematuria 02/22/2020  . Albuminuria 10/13/2018  . CKD (chronic kidney disease) 06/16/2018  . Class 2 severe obesity due to excess calories with serious comorbidity in adult ([place]) 06/16/2018  . Diabetic neuropathy ([place]) 06/16/2018  . ED (erectile dysfunction) 06/16/2018  . Hyperlipidemia 06/16/2018  . Neuropathy 06/16/2018  . Osteoarthritis 06/16/2018  . Pathological fracture of vertebra 06/16/2018  . Psoriasis 06/16/2018  . Hypoglycemia 04/11/2015  . Diabetes mellitus with renal manifestation ([place]) 04/11/2015  . Essential hypertension 04/11/2015    Procedures performed: Procedure(s): TRANSURETHRAL RESECTION OF BLADDER TUMOR BIL RETROGRADE  AND URETHRAL DIALATION WITH BLADDER NECK INCSION  History and Physical: For full details, please see admission history and physical. Briefly, George Morgan is a 79 y.o. year old patient with bladder cancer and gross hematuria.   Hospital Course: Patient tolerated the procedure well.  He was then transferred to the floor after an uneventful PACU stay.  His hospital course was uncomplicated.  On POD#1 he had met discharge criteria: was eating a regular diet, was up and ambulating independently,  pain was well controlled, his urine was blood tinged with no significant urethral bleeding, and was ready to for discharge.   Laboratory values:  Recent Labs    02/22/20 0729  HGB 14.6  HCT 43.0   Recent Labs    02/22/20 0729  NA 145  K 3.6  CL 105  GLUCOSE 116*  BUN 28*  CREATININE 1.80*   No results for input(s): LABPT, INR in the last 72 hours. No results for input(s): LABURIN in the last 72 hours. Results for orders placed or performed during the hospital encounter of 02/18/20  SARS CORONAVIRUS  2 (TAT 6-24 HRS) Nasopharyngeal Nasopharyngeal Swab     Status: None   Collection Time: 02/18/20  1:17 PM   Specimen: Nasopharyngeal Swab  Result Value Ref Range Status   SARS Coronavirus 2 NEGATIVE NEGATIVE Final    Comment: (NOTE) SARS-CoV-2 target nucleic acids are NOT DETECTED. The SARS-CoV-2 RNA is generally detectable in upper and lower respiratory specimens during the acute phase of infection. Negative results do not preclude SARS-CoV-2 infection, do not rule out co-infections with other pathogens, and should not be used as the sole basis for treatment or other patient management decisions. Negative results must be combined with clinical observations, patient history, and epidemiological information. The expected result is Negative. Fact Sheet for Patients: SugarRoll.be Fact Sheet for Healthcare Providers: https://www.woods-mathews.com/ This test is not yet approved or cleared by the [place] FDA and  has been authorized for detection and/or diagnosis of SARS-CoV-2 by FDA under an Emergency Use Authorization (EUA). This EUA will remain  in effect (meaning this test can be used) for the duration of the COVID-19 declaration under Section 56 4(b)(1) of the Act, 21 U.S.C. section 360bbb-3(b)(1), unless the authorization is terminated or revoked sooner. Performed at [facility], [place] [address], [place], [place] [address]     Disposition: Home  Discharge instruction: The patient was instructed to be ambulatory but told to refrain from heavy lifting, strenuous activity, or driving.   Discharge medications:  Allergies as of 02/23/2020   No Known Allergies     Medication List    TAKE these medications   Alpha-Lipoic Acid 100 MG Caps Take by mouth daily.  atorvastatin 20 MG tablet Commonly known as: LIPITOR Take 20 mg by mouth every evening.   BIOTIN FORTE PO Take by mouth daily.   cephALEXin 500 MG  capsule Commonly known as: KEFLEX Take 1 capsule (500 mg total) by mouth 3 (three) times daily for 5 days.   CHROMIUM PICOLATE PO Take by mouth daily.   Cinnamon 500 MG capsule Take 500 mg by mouth in the morning and at bedtime.   COQ10 PO Take 300 mg by mouth daily.   ELDERBERRY PO Take by mouth daily.   furosemide 40 MG tablet Commonly known as: LASIX Take 40 mg by mouth daily.   glucosamine-chondroitin 500-400 MG tablet Take 1 tablet by mouth in the morning and at bedtime.   HumaLOG KwikPen 100 UNIT/ML KiwkPen Generic drug: insulin lispro Inject 0-20 Units into the skin 3 (three) times daily. Per sliding scale   insulin glargine 100 UNIT/ML injection Commonly known as: LANTUS Inject 35-65 Units into the skin at bedtime. Per sliding scale lantus solostar per pt   LUTEIN PO Take by mouth. 1 tablet daily   NON FORMULARY Curcumin bid   OMEGA-3 FISH OIL PO Take by mouth daily.   OVER THE COUNTER MEDICATION in the morning and at bedtime. [place]   OVER THE COUNTER MEDICATION Niacin daily   OVER THE COUNTER MEDICATION Vitamin b/b 1 tab bid   OVER THE COUNTER MEDICATION Vitamin e daily   OVER THE COUNTER MEDICATION Vitamin d daily   OVER THE COUNTER MEDICATION Zinc 1 tablet daily   OVER THE COUNTER MEDICATION Selenium 1 tablet daily   OVER THE COUNTER MEDICATION reservatrol 1 tablet bid   OVER THE COUNTER MEDICATION barbarine 1 tablet bid   OVER THE COUNTER MEDICATION betacaratine daily   OVER THE COUNTER MEDICATION Vitamin a daily   OVER THE COUNTER MEDICATION Herbal vision daily   OVER THE COUNTER MEDICATION Vitamin k daily   OVER THE COUNTER MEDICATION Garlic 1 daily   OVER THE COUNTER MEDICATION Folic acid daily   OVER THE COUNTER MEDICATION Magnesium/zinc daily   OVER THE COUNTER MEDICATION Zinc 1 daily   phenazopyridine 200 MG tablet Commonly known as: Pyridium Take 1 tablet (200 mg total) by mouth 3 (three) times daily  as needed for pain. Start this medication once catheter has been removed.   potassium chloride SA 20 MEQ tablet Commonly known as: KLOR-CON Take 20 mEq by mouth daily.   PROBIOTIC ACIDOPHILUS PO Take by mouth daily.   traMADol 50 MG tablet Commonly known as: Ultram Take 1-2 tablets (50-100 mg total) by mouth every 6 (six) hours as needed for moderate pain.   UNABLE TO FIND daily. maranga ( tumeric and black pepper)   UNABLE TO FIND Magnesium 1 bid   valsartan 80 MG tablet Commonly known as: DIOVAN Take 80 mg by mouth daily.       Followup:   Follow-up Information    Ardis Hughs, MD On 03/12/2020.   Specialty: Urology Why: 3:15pm Contact information: [place] [address] [place] On 02/29/2020.   Why: Catheter removal Contact information: [place] [phone]

## 2020-02-28 ENCOUNTER — Encounter (HOSPITAL_BASED_OUTPATIENT_CLINIC_OR_DEPARTMENT_OTHER): Payer: Self-pay | Admitting: Urology

## 2020-03-12 DIAGNOSIS — C672 Malignant neoplasm of lateral wall of bladder: Secondary | ICD-10-CM | POA: Diagnosis not present

## 2020-03-12 DIAGNOSIS — R310 Gross hematuria: Secondary | ICD-10-CM | POA: Diagnosis not present

## 2020-03-21 DIAGNOSIS — R310 Gross hematuria: Secondary | ICD-10-CM | POA: Diagnosis not present

## 2020-03-27 DIAGNOSIS — C672 Malignant neoplasm of lateral wall of bladder: Secondary | ICD-10-CM | POA: Diagnosis not present

## 2020-03-27 DIAGNOSIS — R300 Dysuria: Secondary | ICD-10-CM | POA: Diagnosis not present

## 2020-03-27 DIAGNOSIS — R310 Gross hematuria: Secondary | ICD-10-CM | POA: Diagnosis not present

## 2020-04-02 DIAGNOSIS — C672 Malignant neoplasm of lateral wall of bladder: Secondary | ICD-10-CM | POA: Diagnosis not present

## 2020-04-04 DIAGNOSIS — Z5111 Encounter for antineoplastic chemotherapy: Secondary | ICD-10-CM | POA: Diagnosis not present

## 2020-04-04 DIAGNOSIS — C672 Malignant neoplasm of lateral wall of bladder: Secondary | ICD-10-CM | POA: Diagnosis not present

## 2020-04-11 DIAGNOSIS — C672 Malignant neoplasm of lateral wall of bladder: Secondary | ICD-10-CM | POA: Diagnosis not present

## 2020-04-11 DIAGNOSIS — Z5111 Encounter for antineoplastic chemotherapy: Secondary | ICD-10-CM | POA: Diagnosis not present

## 2020-04-16 DIAGNOSIS — R310 Gross hematuria: Secondary | ICD-10-CM | POA: Diagnosis not present

## 2020-05-02 DIAGNOSIS — Z5111 Encounter for antineoplastic chemotherapy: Secondary | ICD-10-CM | POA: Diagnosis not present

## 2020-05-02 DIAGNOSIS — C672 Malignant neoplasm of lateral wall of bladder: Secondary | ICD-10-CM | POA: Diagnosis not present

## 2020-05-09 DIAGNOSIS — C672 Malignant neoplasm of lateral wall of bladder: Secondary | ICD-10-CM | POA: Diagnosis not present

## 2020-05-09 DIAGNOSIS — Z5111 Encounter for antineoplastic chemotherapy: Secondary | ICD-10-CM | POA: Diagnosis not present

## 2020-05-17 DIAGNOSIS — C672 Malignant neoplasm of lateral wall of bladder: Secondary | ICD-10-CM | POA: Diagnosis not present

## 2020-05-17 DIAGNOSIS — Z5111 Encounter for antineoplastic chemotherapy: Secondary | ICD-10-CM | POA: Diagnosis not present

## 2020-05-24 DIAGNOSIS — C672 Malignant neoplasm of lateral wall of bladder: Secondary | ICD-10-CM | POA: Diagnosis not present

## 2020-05-24 DIAGNOSIS — Z5111 Encounter for antineoplastic chemotherapy: Secondary | ICD-10-CM | POA: Diagnosis not present

## 2020-06-29 DIAGNOSIS — Z23 Encounter for immunization: Secondary | ICD-10-CM | POA: Diagnosis not present

## 2020-08-15 DIAGNOSIS — E7800 Pure hypercholesterolemia, unspecified: Secondary | ICD-10-CM | POA: Diagnosis not present

## 2020-08-15 DIAGNOSIS — Z794 Long term (current) use of insulin: Secondary | ICD-10-CM | POA: Diagnosis not present

## 2020-08-15 DIAGNOSIS — E1142 Type 2 diabetes mellitus with diabetic polyneuropathy: Secondary | ICD-10-CM | POA: Diagnosis not present

## 2020-08-15 DIAGNOSIS — N183 Chronic kidney disease, stage 3 unspecified: Secondary | ICD-10-CM | POA: Diagnosis not present

## 2020-08-15 DIAGNOSIS — I10 Essential (primary) hypertension: Secondary | ICD-10-CM | POA: Diagnosis not present

## 2020-08-20 DIAGNOSIS — N35013 Post-traumatic anterior urethral stricture: Secondary | ICD-10-CM | POA: Diagnosis not present

## 2020-08-20 DIAGNOSIS — C672 Malignant neoplasm of lateral wall of bladder: Secondary | ICD-10-CM | POA: Diagnosis not present

## 2020-10-04 DIAGNOSIS — H5203 Hypermetropia, bilateral: Secondary | ICD-10-CM | POA: Diagnosis not present

## 2020-10-04 DIAGNOSIS — H52203 Unspecified astigmatism, bilateral: Secondary | ICD-10-CM | POA: Diagnosis not present

## 2020-10-04 DIAGNOSIS — E113293 Type 2 diabetes mellitus with mild nonproliferative diabetic retinopathy without macular edema, bilateral: Secondary | ICD-10-CM | POA: Diagnosis not present

## 2020-10-04 DIAGNOSIS — Z961 Presence of intraocular lens: Secondary | ICD-10-CM | POA: Diagnosis not present

## 2020-11-22 IMAGING — CR DG CHEST 2V
2 series · 2 of 2 positions shown · non-contrast
Comparison: None.

CLINICAL DATA: Operative evaluation, bladder surgery, diabetes,
hypertension

EXAM:
CHEST - 2 VIEW

[w chest pa]
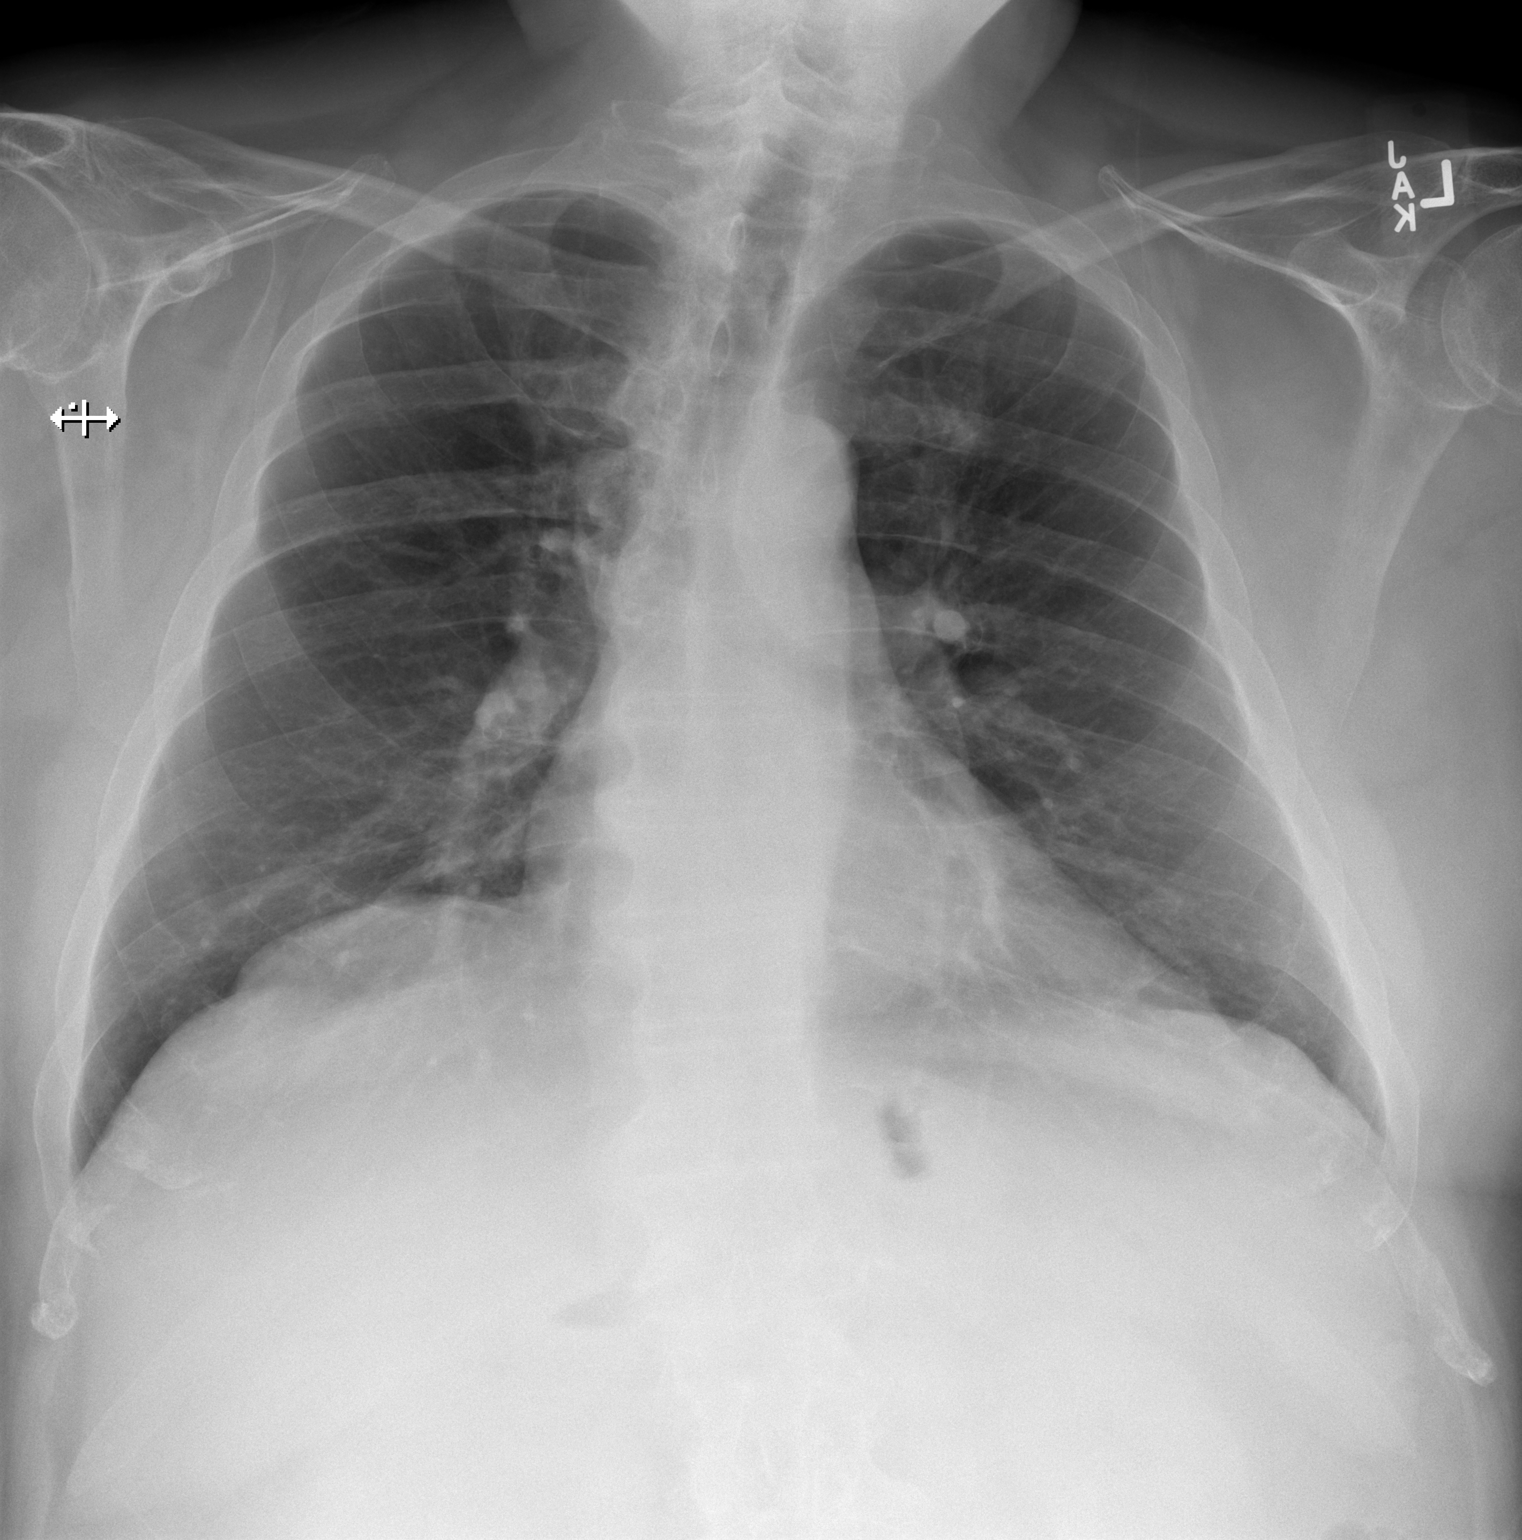

[w chest lat]
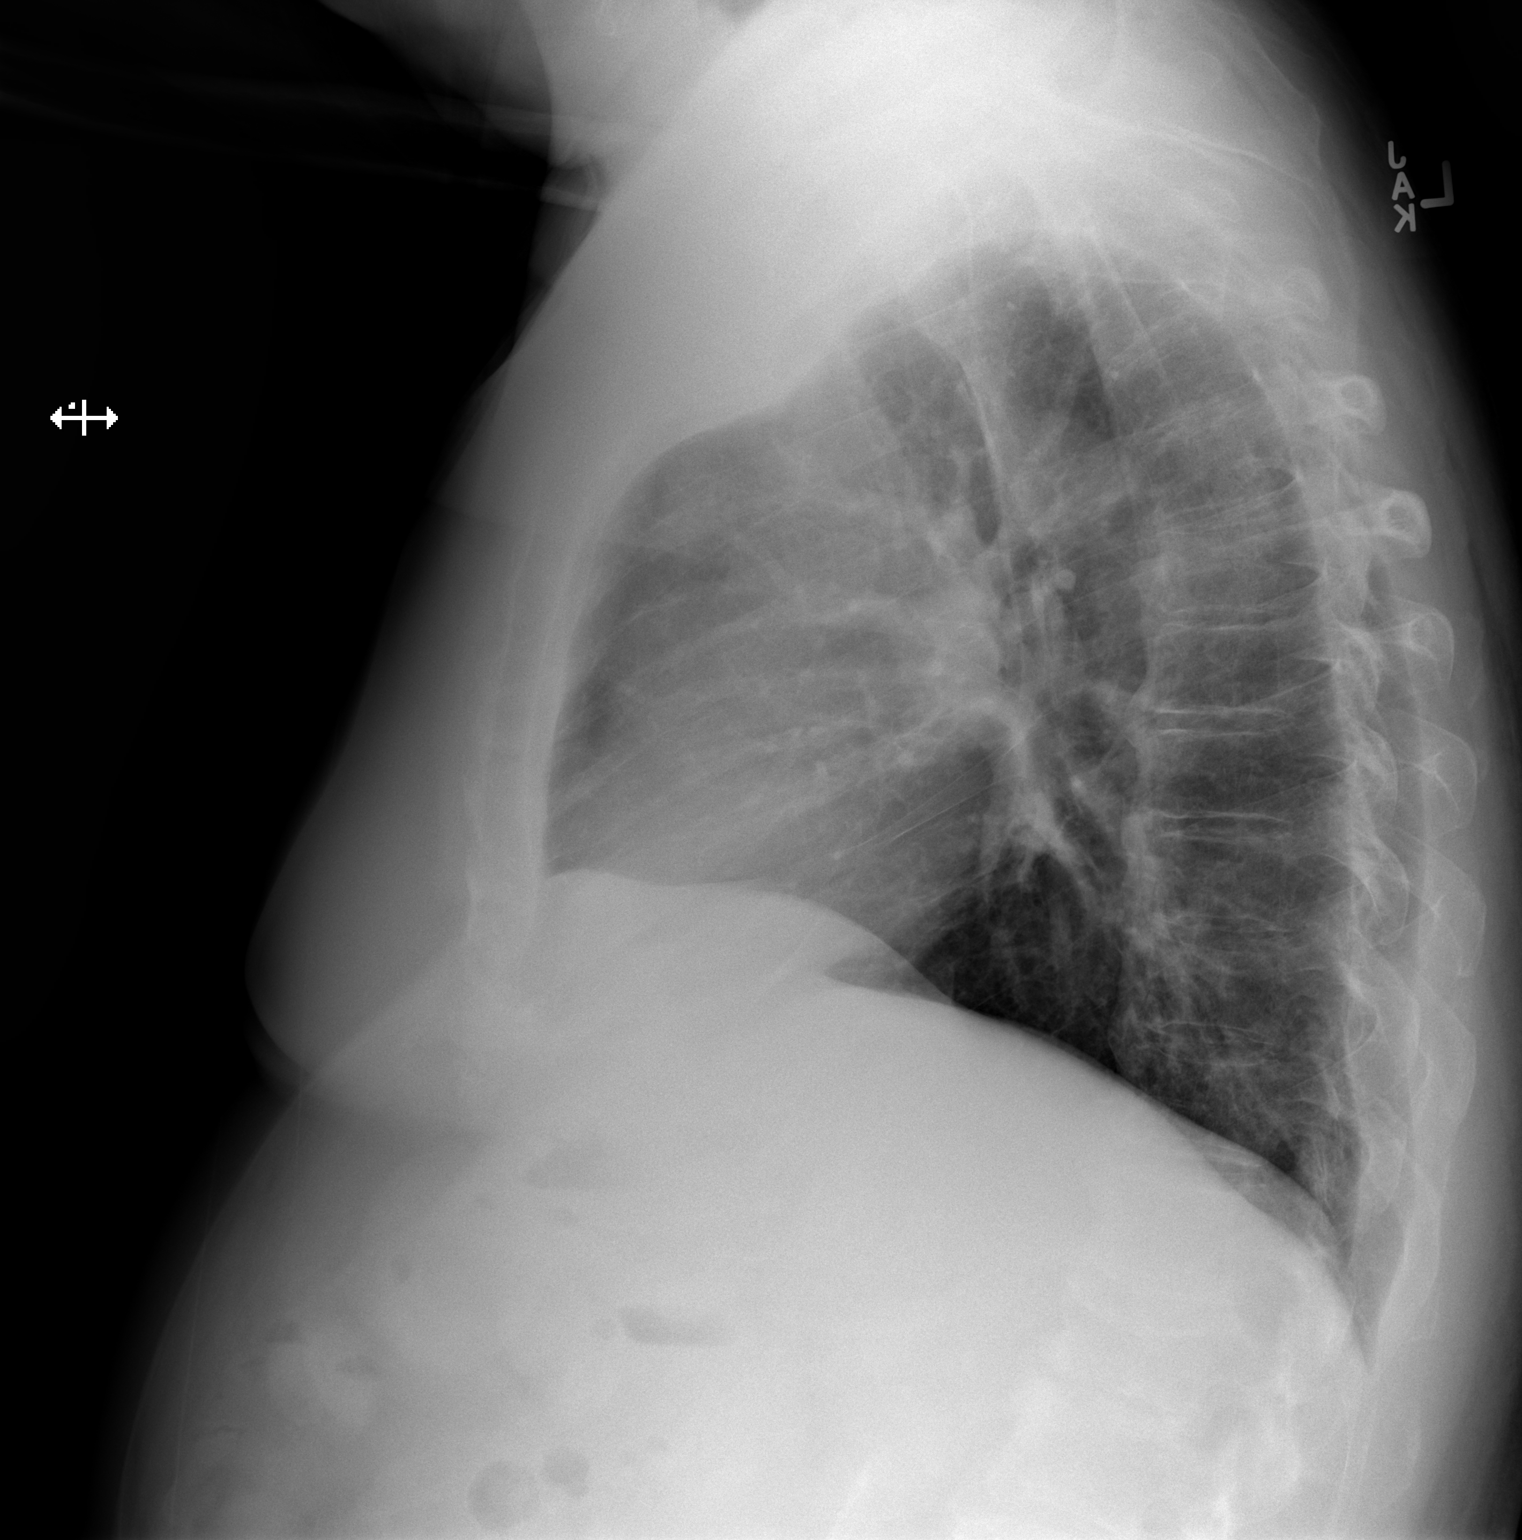

[2 of 2 positions shown; findings below may reference images not displayed]

FINDINGS: The heart size and mediastinal contours are within normal limits.
Both lungs are clear. The visualized skeletal structures are
unremarkable.
IMPRESSION: No active cardiopulmonary disease.

## 2021-03-29 DIAGNOSIS — Z0000 Encounter for general adult medical examination without abnormal findings: Secondary | ICD-10-CM | POA: Diagnosis not present

## 2021-03-29 DIAGNOSIS — G8929 Other chronic pain: Secondary | ICD-10-CM | POA: Diagnosis not present

## 2021-03-29 DIAGNOSIS — N183 Chronic kidney disease, stage 3 unspecified: Secondary | ICD-10-CM | POA: Diagnosis not present

## 2021-03-29 DIAGNOSIS — I10 Essential (primary) hypertension: Secondary | ICD-10-CM | POA: Diagnosis not present

## 2021-03-29 DIAGNOSIS — E7800 Pure hypercholesterolemia, unspecified: Secondary | ICD-10-CM | POA: Diagnosis not present

## 2021-03-29 DIAGNOSIS — E1142 Type 2 diabetes mellitus with diabetic polyneuropathy: Secondary | ICD-10-CM | POA: Diagnosis not present

## 2021-04-25 DIAGNOSIS — C679 Malignant neoplasm of bladder, unspecified: Secondary | ICD-10-CM | POA: Diagnosis not present

## 2021-04-25 DIAGNOSIS — C672 Malignant neoplasm of lateral wall of bladder: Secondary | ICD-10-CM | POA: Diagnosis not present

## 2021-07-26 DIAGNOSIS — C672 Malignant neoplasm of lateral wall of bladder: Secondary | ICD-10-CM | POA: Diagnosis not present

## 2021-07-29 ENCOUNTER — Other Ambulatory Visit: Payer: Self-pay | Admitting: Urology

## 2021-07-31 NOTE — Patient Instructions (Addendum)
DUE TO COVID-19 ONLY ONE VISITOR IS ALLOWED TO COME WITH YOU AND STAY IN THE WAITING ROOM ONLY DURING PRE OP AND PROCEDURE.   **NO VISITORS ARE ALLOWED IN THE SHORT STAY AREA OR RECOVERY ROOM!!**       Your procedure is scheduled on: 08/15/21   Report to [facility] Main Entrance    Report to admitting at 7:05 AM   Call this number if you have problems the morning of surgery [phone]   Do not eat food :After Midnight.   May have liquids until 6:20 AM day of surgery  CLEAR LIQUID DIET  Foods Allowed                                                                     Foods Excluded  Water, Black Coffee and tea (no milk or creamer)           liquids that you cannot  Plain Jell-O in any flavor  (No red)                                    see through such as: Fruit ices (not with fruit pulp)                                            milk, soups, orange juice              Iced Popsicles (No red)                                               All solid food                                   Apple juices Sports drinks like Gatorade (No red) Lightly seasoned clear broth or consume(fat free) Sugar   Oral Hygiene is also important to reduce your risk of infection.                                    Remember - BRUSH YOUR TEETH THE MORNING OF SURGERY WITH YOUR REGULAR TOOTHPASTE   Take these medicines the morning of surgery with A SIP OF WATER: None  DO NOT TAKE ANY ORAL DIABETIC MEDICATIONS DAY OF YOUR SURGERY  How to Manage Your Diabetes Before and After Surgery  Why is it important to control my blood sugar before and after surgery? Improving blood sugar levels before and after surgery helps healing and can limit problems. A way of improving blood sugar control is eating a healthy diet by:  Eating less sugar and carbohydrates  Increasing activity/exercise  Talking with your doctor about reaching your blood sugar goals High blood sugars (greater than 180 mg/dL) can raise  your risk of infections and slow your recovery, so you will need  to focus on controlling your diabetes during the weeks before surgery. Make sure that the doctor who takes care of your diabetes knows about your planned surgery including the date and location.  How do I manage my blood sugar before surgery? Check your blood sugar at least 4 times a day, starting 2 days before surgery, to make sure that the level is not too high or low. Check your blood sugar the morning of your surgery when you wake up and every 2 hours until you get to the Short Stay unit. If your blood sugar is less than 70 mg/dL, you will need to treat for low blood sugar: Do not take insulin. Treat a low blood sugar (less than 70 mg/dL) with  cup of clear juice (cranberry or apple), 4 glucose tablets, OR glucose gel. Recheck blood sugar in 15 minutes after treatment (to make sure it is greater than 70 mg/dL). If your blood sugar is not greater than 70 mg/dL on recheck, call [phone] for further instructions. Report your blood sugar to the short stay nurse when you get to Short Stay.  If you are admitted to the hospital after surgery: Your blood sugar will be checked by the staff and you will probably be given insulin after surgery (instead of oral diabetes medicines) to make sure you have good blood sugar levels. The goal for blood sugar control after surgery is 80-180 mg/dL.   WHAT DO I DO ABOUT MY DIABETES MEDICATION?  Do not take oral diabetes medicines (pills) the morning of surgery.  THE DAY BEFORE SURGERY, take usual doses of Humalog before meals, none at bedtime. Take 50% of bedtime Lantus.       THE MORNING OF SURGERY, take 50% of Lantus. Do not take Humalog unless blood sugar is greater than 220, then take 50%   Reviewed and Endorsed by [facility] Patient Education Committee, August 2015                               You may not have any metal on your body including jewelry, and body piercing              Do not wear lotions, powders, cologne, or deodorant              Men may shave face and neck.   Do not bring valuables to the hospital. [place].   Contacts, dentures or bridgework may not be worn into surgery.    Patients discharged on the day of surgery will not be allowed to drive home.              Please read over the following fact sheets you were given: IF YOU HAVE QUESTIONS ABOUT YOUR PRE-OP INSTRUCTIONS PLEASE CALL [facility] - Preparing for Surgery Before surgery, you can play an important role.  Because skin is not sterile, your skin needs to be as free of germs as possible.  You can reduce the number of germs on your skin by washing with CHG (chlorahexidine gluconate) soap before surgery.  CHG is an antiseptic cleaner which kills germs and bonds with the skin to continue killing germs even after washing. Please DO NOT use if you have an allergy to CHG or antibacterial soaps.  If your skin becomes reddened/irritated stop using the  CHG and inform your nurse when you arrive at Short Stay. Do not shave (including legs and underarms) for at least 48 hours prior to the first CHG shower.  You may shave your face/neck.  Please follow these instructions carefully:  1.  Shower with CHG Soap the night before surgery and the  morning of surgery.  2.  If you choose to wash your hair, wash your hair first as usual with your normal  shampoo.  3.  After you shampoo, rinse your hair and body thoroughly to remove the shampoo.                             4.  Use CHG as you would any other liquid soap.  You can apply chg directly to the skin and wash.  Gently with a scrungie or clean washcloth.  5.  Apply the CHG Soap to your body ONLY FROM THE NECK DOWN.   Do   not use on face/ open                           Wound or open sores. Avoid contact with eyes, ears mouth and   genitals (private parts).                       Wash  face,  Genitals (private parts) with your normal soap.             6.  Wash thoroughly, paying special attention to the area where your    surgery  will be performed.  7.  Thoroughly rinse your body with warm water from the neck down.  8.  DO NOT shower/wash with your normal soap after using and rinsing off the CHG Soap.                9.  Pat yourself dry with a clean towel.            10.  Wear clean pajamas.            11.  Place clean sheets on your bed the night of your first shower and do not  sleep with pets. Day of Surgery : Do not apply any lotions/deodorants the morning of surgery.  Please wear clean clothes to the hospital/surgery center.  FAILURE TO FOLLOW THESE INSTRUCTIONS MAY RESULT IN THE CANCELLATION OF YOUR SURGERY  PATIENT SIGNATURE_________________________________  NURSE SIGNATURE__________________________________  ________________________________________________________________________

## 2021-07-31 NOTE — Progress Notes (Addendum)
COVID swab appointment: n/a  COVID Vaccine Completed: yes x2 Date COVID Vaccine completed: Has received booster: COVID vaccine manufacturer: [employer]   Date of COVID positive in last 90 days:  no  PCP - Shirline Frees, MD Cardiologist - n/a  Chest x-ray - n/a EKG - 08/01/21 Epic/chart Stress Test - many years per pt ECHO - n/a Cardiac Cath - n/a Pacemaker/ICD device last checked: n/a Spinal Cord Stimulator: n/a  Sleep Study - n/a CPAP -   Fasting Blood Sugar - 65-300 Checks Blood Sugar _3-6_ times a day  Blood Thinner Instructions: n/a Aspirin Instructions: Last Dose:  Activity level: Can go up a flight of stairs and perform activities of daily living without stopping and without symptoms of chest pain or shortness of breath.  Anesthesia review: HTN, DVT, CKD, palpitations, dizziness, STOP BANG 5, creatinine 1.65 results routed to Dr. Louis Meckel  Patient denies shortness of breath, fever, cough and chest pain at PAT appointment   Patient verbalized understanding of instructions that were given to them at the PAT appointment. Patient was also instructed that they will need to review over the PAT instructions again at home before surgery.

## 2021-08-01 ENCOUNTER — Encounter (HOSPITAL_COMMUNITY)
Admission: RE | Admit: 2021-08-01 | Discharge: 2021-08-01 | Disposition: A | Discharge status: Home / Self Care | Payer: Medicare Other | Source: Ambulatory Visit | Attending: Urology | Admitting: Urology

## 2021-08-01 ENCOUNTER — Encounter (HOSPITAL_COMMUNITY): Payer: Self-pay

## 2021-08-01 VITALS — BP 146/81 | HR 77 | Temp 97.9°F | Resp 14 | Ht 65.5 in | Wt 218.4 lb

## 2021-08-01 DIAGNOSIS — E119 Type 2 diabetes mellitus without complications: Secondary | ICD-10-CM | POA: Insufficient documentation

## 2021-08-01 DIAGNOSIS — Z794 Long term (current) use of insulin: Secondary | ICD-10-CM | POA: Insufficient documentation

## 2021-08-01 DIAGNOSIS — I10 Essential (primary) hypertension: Secondary | ICD-10-CM | POA: Insufficient documentation

## 2021-08-01 DIAGNOSIS — Z01818 Encounter for other preprocedural examination: Secondary | ICD-10-CM | POA: Insufficient documentation

## 2021-08-01 LAB — BASIC METABOLIC PANEL
Anion gap: 8 (ref 5–15)
BUN: 35 mg/dL — ABNORMAL HIGH (ref 8–23)
CO2: 29 mmol/L (ref 22–32)
Calcium: 9.3 mg/dL (ref 8.9–10.3)
Chloride: 104 mmol/L (ref 98–111)
Creatinine, Ser: 1.65 mg/dL — ABNORMAL HIGH (ref 0.61–1.24)
GFR, Estimated: 42 mL/min — ABNORMAL LOW (ref >60)
Glucose, Bld: 67 mg/dL — ABNORMAL LOW (ref 70–99)
Potassium: 4.2 mmol/L (ref 3.5–5.1)
Sodium: 141 mmol/L (ref 135–145)

## 2021-08-01 LAB — CBC
HCT: 43.7 % (ref 39.0–52.0)
Hemoglobin: 15 g/dL (ref 13.0–17.0)
MCH: 30.3 pg (ref 26.0–34.0)
MCHC: 34.3 g/dL (ref 30.0–36.0)
MCV: 88.3 fL (ref 80.0–100.0)
Platelets: 202 10*3/uL (ref 150–400)
RBC: 4.95 MIL/uL (ref 4.22–5.81)
RDW: 12.8 % (ref 11.5–15.5)
WBC: 8.1 10*3/uL (ref 4.0–10.5)
nRBC: 0 % (ref 0.0–0.2)

## 2021-08-01 LAB — HEMOGLOBIN A1C
Hgb A1c MFr Bld: 6 % — ABNORMAL HIGH (ref 4.8–5.6)
Mean Plasma Glucose: 125.5 mg/dL

## 2021-08-01 LAB — GLUCOSE, CAPILLARY: Glucose-Capillary: 73 mg/dL (ref 70–99)

## 2021-08-01 NOTE — Progress Notes (Signed)
   08/01/21 1035  OBSTRUCTIVE SLEEP APNEA  Have you ever been diagnosed with sleep apnea through a sleep study? No  Do you snore loudly (loud enough to be heard through closed doors)?  0  Do you often feel tired, fatigued, or sleepy during the daytime (such as falling asleep during driving or talking to someone)? 1  Has anyone observed you stop breathing during your sleep? 0  Do you have, or are you being treated for high blood pressure? 1  BMI more than 35 kg/m2? 1  Age > 50 (1-yes) 1  Neck circumference greater than:Male 16 inches or larger, Male 17inches or larger? 0  Male Gender (Yes=1) 1  Obstructive Sleep Apnea Score 5

## 2021-08-15 ENCOUNTER — Ambulatory Visit (HOSPITAL_COMMUNITY): Payer: Medicare Other | Admitting: Physician Assistant

## 2021-08-15 ENCOUNTER — Encounter (HOSPITAL_COMMUNITY): Payer: Self-pay | Admitting: Urology

## 2021-08-15 ENCOUNTER — Ambulatory Visit (HOSPITAL_COMMUNITY): Payer: Medicare Other | Admitting: Certified Registered Nurse Anesthetist

## 2021-08-15 ENCOUNTER — Ambulatory Visit (HOSPITAL_COMMUNITY)
Admission: RE | Admit: 2021-08-15 | Discharge: 2021-08-15 | Disposition: A | Discharge status: Home / Self Care | Payer: Medicare Other | Source: Ambulatory Visit | Attending: Urology | Admitting: Urology

## 2021-08-15 ENCOUNTER — Encounter (HOSPITAL_COMMUNITY)
Admission: RE | Disposition: A | Discharge status: Home / Self Care | Payer: Self-pay | Source: Ambulatory Visit | Attending: Urology

## 2021-08-15 ENCOUNTER — Ambulatory Visit (HOSPITAL_COMMUNITY): Payer: Medicare Other

## 2021-08-15 DIAGNOSIS — Z6835 Body mass index (BMI) 35.0-35.9, adult: Secondary | ICD-10-CM | POA: Diagnosis not present

## 2021-08-15 DIAGNOSIS — I10 Essential (primary) hypertension: Secondary | ICD-10-CM | POA: Diagnosis not present

## 2021-08-15 DIAGNOSIS — C673 Malignant neoplasm of anterior wall of bladder: Secondary | ICD-10-CM | POA: Insufficient documentation

## 2021-08-15 DIAGNOSIS — Z87891 Personal history of nicotine dependence: Secondary | ICD-10-CM | POA: Insufficient documentation

## 2021-08-15 DIAGNOSIS — C671 Malignant neoplasm of dome of bladder: Secondary | ICD-10-CM | POA: Diagnosis not present

## 2021-08-15 DIAGNOSIS — N35914 Unspecified anterior urethral stricture, male: Secondary | ICD-10-CM | POA: Insufficient documentation

## 2021-08-15 DIAGNOSIS — I129 Hypertensive chronic kidney disease with stage 1 through stage 4 chronic kidney disease, or unspecified chronic kidney disease: Secondary | ICD-10-CM | POA: Diagnosis not present

## 2021-08-15 DIAGNOSIS — E119 Type 2 diabetes mellitus without complications: Secondary | ICD-10-CM | POA: Insufficient documentation

## 2021-08-15 DIAGNOSIS — N189 Chronic kidney disease, unspecified: Secondary | ICD-10-CM | POA: Diagnosis not present

## 2021-08-15 DIAGNOSIS — N35911 Unspecified urethral stricture, male, meatal: Secondary | ICD-10-CM | POA: Diagnosis not present

## 2021-08-15 DIAGNOSIS — E1122 Type 2 diabetes mellitus with diabetic chronic kidney disease: Secondary | ICD-10-CM | POA: Diagnosis not present

## 2021-08-15 DIAGNOSIS — Z794 Long term (current) use of insulin: Secondary | ICD-10-CM | POA: Insufficient documentation

## 2021-08-15 DIAGNOSIS — R310 Gross hematuria: Secondary | ICD-10-CM

## 2021-08-15 DIAGNOSIS — C679 Malignant neoplasm of bladder, unspecified: Secondary | ICD-10-CM | POA: Diagnosis not present

## 2021-08-15 HISTORY — PX: TRANSURETHRAL RESECTION OF BLADDER TUMOR WITH MITOMYCIN-C: SHX6459

## 2021-08-15 LAB — GLUCOSE, CAPILLARY
Glucose-Capillary: 71 mg/dL (ref 70–99)
Glucose-Capillary: 64 mg/dL — ABNORMAL LOW (ref 70–99)
Glucose-Capillary: 96 mg/dL (ref 70–99)

## 2021-08-15 SURGERY — TRANSURETHRAL RESECTION OF BLADDER TUMOR WITH MITOMYCIN-C
Anesthesia: General | Site: Bladder | Laterality: Bilateral

## 2021-08-15 MED ORDER — FENTANYL CITRATE PF 50 MCG/ML IJ SOSY: 25.0000 ug | PREFILLED_SYRINGE | INTRAMUSCULAR | Status: DC | PRN

## 2021-08-15 MED ORDER — MIDAZOLAM HCL 2 MG/2ML IJ SOLN
INTRAMUSCULAR | Status: AC
  Filled 2021-08-15: qty 2

## 2021-08-15 MED ORDER — DEXAMETHASONE SODIUM PHOSPHATE 4 MG/ML IJ SOLN
INTRAMUSCULAR | Status: DC | PRN
  Administered 2021-08-15: 5 mg via INTRAVENOUS

## 2021-08-15 MED ORDER — CHLORHEXIDINE GLUCONATE 0.12 % MT SOLN
15.0000 mL | Freq: Once | OROMUCOSAL | Status: AC
  Administered 2021-08-15: 15 mL via OROMUCOSAL

## 2021-08-15 MED ORDER — SODIUM CHLORIDE 0.9 % IR SOLN
Status: DC | PRN
  Administered 2021-08-15: 6000 mL via INTRAVESICAL

## 2021-08-15 MED ORDER — DEXTROSE 50 % IV SOLN
25.0000 mL | Freq: Once | INTRAVENOUS | Status: AC
  Administered 2021-08-15: 25 mL via INTRAVENOUS
  Filled 2021-08-15: qty 50

## 2021-08-15 MED ORDER — FENTANYL CITRATE (PF) 100 MCG/2ML IJ SOLN
INTRAMUSCULAR | Status: DC | PRN
  Administered 2021-08-15: 50 ug via INTRAVENOUS
  Administered 2021-08-15 (×2): 25 ug via INTRAVENOUS

## 2021-08-15 MED ORDER — PROPOFOL 10 MG/ML IV BOLUS
INTRAVENOUS | Status: DC | PRN
  Administered 2021-08-15: 150 mg via INTRAVENOUS

## 2021-08-15 MED ORDER — DEXTROSE 50 % IV SOLN
INTRAVENOUS | Status: AC
  Filled 2021-08-15: qty 50

## 2021-08-15 MED ORDER — TRAMADOL HCL 50 MG PO TABS: 50.0000 mg | ORAL_TABLET | Freq: Four times a day (QID) | ORAL | 0 refills | Status: AC | PRN

## 2021-08-15 MED ORDER — DEXAMETHASONE SODIUM PHOSPHATE 10 MG/ML IJ SOLN
INTRAMUSCULAR | Status: AC
  Filled 2021-08-15: qty 1

## 2021-08-15 MED ORDER — EPHEDRINE 5 MG/ML INJ
INTRAVENOUS | Status: AC
  Filled 2021-08-15: qty 5

## 2021-08-15 MED ORDER — ORAL CARE MOUTH RINSE: 15.0000 mL | Freq: Once | OROMUCOSAL | Status: AC

## 2021-08-15 MED ORDER — LACTATED RINGERS IV SOLN: INTRAVENOUS | Status: DC

## 2021-08-15 MED ORDER — CEFAZOLIN SODIUM-DEXTROSE 2-4 GM/100ML-% IV SOLN
2.0000 g | INTRAVENOUS | Status: AC
  Administered 2021-08-15: 2 g via INTRAVENOUS
  Filled 2021-08-15: qty 100

## 2021-08-15 MED ORDER — DEXTROSE 50 % IV SOLN
12.5000 g | Freq: Once | INTRAVENOUS | Status: AC
  Administered 2021-08-15: 12.5 g via INTRAVENOUS

## 2021-08-15 MED ORDER — LIDOCAINE HCL (PF) 2 % IJ SOLN
INTRAMUSCULAR | Status: AC
  Filled 2021-08-15: qty 5

## 2021-08-15 MED ORDER — STERILE WATER FOR IRRIGATION IR SOLN
Status: DC | PRN
  Administered 2021-08-15: 3000 mL via INTRAVESICAL

## 2021-08-15 MED ORDER — LIDOCAINE 2% (20 MG/ML) 5 ML SYRINGE
INTRAMUSCULAR | Status: DC | PRN
  Administered 2021-08-15: 60 mg via INTRAVENOUS

## 2021-08-15 MED ORDER — ACETAMINOPHEN 500 MG PO TABS
1000.0000 mg | ORAL_TABLET | Freq: Once | ORAL | Status: AC
  Administered 2021-08-15: 1000 mg via ORAL
  Filled 2021-08-15: qty 2

## 2021-08-15 MED ORDER — GEMCITABINE CHEMO FOR BLADDER INSTILLATION 2000 MG
2000.0000 mg | Freq: Once | INTRAVENOUS | Status: AC
  Administered 2021-08-15: 2000 mg via INTRAVESICAL
  Filled 2021-08-15: qty 2000

## 2021-08-15 MED ORDER — ONDANSETRON HCL 4 MG/2ML IJ SOLN
INTRAMUSCULAR | Status: DC | PRN
  Administered 2021-08-15: 4 mg via INTRAVENOUS

## 2021-08-15 MED ORDER — PHENAZOPYRIDINE HCL 200 MG PO TABS: 200.0000 mg | ORAL_TABLET | Freq: Three times a day (TID) | ORAL | 0 refills | Status: AC | PRN

## 2021-08-15 MED ORDER — EPHEDRINE SULFATE-NACL 50-0.9 MG/10ML-% IV SOSY
PREFILLED_SYRINGE | INTRAVENOUS | Status: DC | PRN
  Administered 2021-08-15: 5 mg via INTRAVENOUS

## 2021-08-15 MED ORDER — PHENYLEPHRINE 40 MCG/ML (10ML) SYRINGE FOR IV PUSH (FOR BLOOD PRESSURE SUPPORT)
PREFILLED_SYRINGE | INTRAVENOUS | Status: AC
  Filled 2021-08-15: qty 10

## 2021-08-15 MED ORDER — IOHEXOL 300 MG/ML  SOLN
INTRAMUSCULAR | Status: DC | PRN
  Administered 2021-08-15: 10 mL

## 2021-08-15 MED ORDER — FENTANYL CITRATE (PF) 100 MCG/2ML IJ SOLN
INTRAMUSCULAR | Status: AC
  Filled 2021-08-15: qty 2

## 2021-08-15 MED ORDER — PHENYLEPHRINE 40 MCG/ML (10ML) SYRINGE FOR IV PUSH (FOR BLOOD PRESSURE SUPPORT)
PREFILLED_SYRINGE | INTRAVENOUS | Status: DC | PRN
  Administered 2021-08-15: 80 ug via INTRAVENOUS
  Administered 2021-08-15: 120 ug via INTRAVENOUS
  Administered 2021-08-15: 80 ug via INTRAVENOUS

## 2021-08-15 MED ORDER — ONDANSETRON HCL 4 MG/2ML IJ SOLN
INTRAMUSCULAR | Status: AC
  Filled 2021-08-15: qty 2

## 2021-08-15 SURGICAL SUPPLY — 17 items
BAG URO CATCHER STRL LF (MISCELLANEOUS) ×2 IMPLANT
CATH FOLEY 2WAY SLVR  5CC 18FR (CATHETERS) ×1
CATH FOLEY 2WAY SLVR 5CC 18FR (CATHETERS) ×1 IMPLANT
DRAPE FOOT SWITCH (DRAPES) ×2 IMPLANT
ELECT REM PT RETURN 15FT ADLT (MISCELLANEOUS) ×2 IMPLANT
GLOVE SURG ENC TEXT LTX SZ7.5 (GLOVE) ×2 IMPLANT
GOWN STRL REUS W/TWL XL LVL3 (GOWN DISPOSABLE) ×2 IMPLANT
KIT TURNOVER KIT A (KITS) IMPLANT
LOOP CUT BIPOLAR 24F LRG (ELECTROSURGICAL) IMPLANT
MANIFOLD NEPTUNE II (INSTRUMENTS) ×2 IMPLANT
NDL SAFETY ECLIPSE 18X1.5 (NEEDLE) ×1 IMPLANT
NEEDLE HYPO 18GX1.5 SHARP (NEEDLE) ×1
PACK CYSTO (CUSTOM PROCEDURE TRAY) ×2 IMPLANT
SYR TOOMEY IRRIG 70ML (MISCELLANEOUS)
SYRINGE TOOMEY IRRIG 70ML (MISCELLANEOUS) IMPLANT
TUBING CONNECTING 10 (TUBING) ×2 IMPLANT
TUBING UROLOGY SET (TUBING) ×2 IMPLANT

## 2021-08-15 NOTE — Anesthesia Procedure Notes (Signed)
Procedure Name: LMA Insertion Date/Time: 08/15/2021 9:50 AM Performed by: Claudia Desanctis, CRNA Pre-anesthesia Checklist: Emergency Drugs available, Patient identified, Suction available and Patient being monitored Patient Re-evaluated:Patient Re-evaluated prior to induction Oxygen Delivery Method: Circle system utilized Preoxygenation: Pre-oxygenation with 100% oxygen Induction Type: IV induction Ventilation: Mask ventilation without difficulty LMA: LMA inserted LMA Size: 4.0 Number of attempts: 1 Placement Confirmation: positive ETCO2 and breath sounds checked- equal and bilateral Tube secured with: Tape Dental Injury: Teeth and Oropharynx as per pre-operative assessment

## 2021-08-15 NOTE — H&P (Signed)
f/u for transitional cell carcinoma  HPI: George Morgan is a 80 year-old male established patient who is here for surveillance of bladder cancer.  He underwent a TURBT. His last bladder tumor was resected approximately 04/15/2021. He has had the a total of 2 bladder resections. The patient had their first TURBT in 02/19/2020.   Tumor Pathology: HG NMIBC.   He had treatment with the following intravesical agents: bcg. The patient underwent induction therapy.   The patient's last treatment was approximately 05/16/2020. His last cysto was approximately 09/15/2020. His cystoscopy demonstrated NED - meatal stenosis.   The patient did not have labs prior to his office visit today.   He is not having pain in new locations. He has had blood in his urine recently. He has not recently had unwanted weight loss.   Patient's surgery was complicated by dense urethral stricture and meatal stenosis. He had some postop hematuria that persisted. However, this has healed. He tolerated 6 doses of BCG.   8/22: The patient was dilated up with the blue tipped dilator and then noted to have a small area of recurrence on the right posterior wall which we biopsied and fulgurated in clinic. This returned as low-grade TCC.   Interval: Today the patient denies any hematuria or dysuria. He denies any real significant voiding symptoms.     ALLERGIES: No Allergies    MEDICATIONS: Atorvastatin Calcium 80 mg tablet  Furosemide 80 mg tablet  Humalog  Humalog Kwikpen U-100 100 unit/ml insulin pen  Lantus Solostar 100 unit/ml (3 ml) insulin pen  Potassium  Valsartan 80 mg tablet     GU PSH: Bladder Instill AntiCA Agent - 05/24/2020, 05/17/2020, 05/09/2020, 05/02/2020, 04/11/2020, 04/04/2020 Cysto Dilate Stricture (M or F) - 08/20/2020, 08/20/2020, 02/22/2020 Cysto Fulgurate < 0.5 cm - 04/25/2021 Cystoscopy - 08/20/2020, 02/06/2020 Cystoscopy TURBT 2-5 cm - 02/22/2020 Initial Male VB Sounds - 08/20/2020 Revise Bladder Neck -  02/22/2020 Subsequent Male F & F's - 04/25/2021     NON-GU PSH: Carpal Tunnel Surgery.., Left Cataract Surgery.. Rotator Cuff Surgery.., Right Tonsillectomy..     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/2020, - 04/02/2020 Epididymo-orchitis - 04/16/2020 Dysuria - 03/27/2020 Gross hematuria - 02/06/2020, - 2021 Encounter for Prostate Cancer screening - 2018 Urinary Obstruction - 2018 Elevated PSA - 2017    NON-GU PMH: Arthritis Diabetes Type 2 Hypercholesterolemia Hypertension Other psoriasis Skin Cancer, History    FAMILY HISTORY: 1 Daughter - Daughter 2 sons - Son Acute CVA (cerebrovascular accident) - Mother, Father Acute Myocardial Infarction - Father, Mother Death of family member - Mother, Father Kidney Stones - Father   SOCIAL HISTORY: Marital Status: Married Preferred Language: English; Ethnicity: Not Hispanic Or Latino; Race: White Current Smoking Status: Unknown if patient smokes.  Has never drank.  Drinks 1 caffeinated drink per day.    REVIEW OF SYSTEMS:    GU Review Male:   Patient reports burning/ pain with urination. Patient denies get up at night to urinate, trouble starting your stream, stream starts and stops, frequent urination, erection problems, leakage of urine, hard to postpone urination, have to strain to urinate , and penile pain.  Gastrointestinal (Upper):   Patient denies nausea, vomiting, and indigestion/ heartburn.  Gastrointestinal (Lower):   Patient denies diarrhea and constipation.  Constitutional:   Patient denies fever, night sweats, weight loss, and fatigue.  Skin:   Patient denies skin rash/ lesion  and itching.  Eyes:   Patient denies blurred vision and double vision.  Ears/ Nose/ Throat:   Patient denies sore throat and sinus problems.  Hematologic/Lymphatic:   Patient denies swollen glands and easy  bruising.  Cardiovascular:   Patient denies leg swelling and chest pains.  Respiratory:   Patient denies cough and shortness of breath.  Endocrine:   Patient denies excessive thirst.  Musculoskeletal:   Patient reports back pain. Patient denies joint pain.  Neurological:   Patient denies headaches and dizziness.  Psychologic:   Patient denies depression and anxiety.   VITAL SIGNS: None   MULTI-SYSTEM PHYSICAL EXAMINATION:    Constitutional: Well-nourished. No physical deformities. Normally developed. Good grooming.  Respiratory: Normal breath sounds. No labored breathing, no use of accessory muscles.   Cardiovascular: Regular rate and rhythm. No murmur, no gallop. Normal temperature, normal extremity pulses, no swelling, no varicosities.      Complexity of Data:  Source Of History:  Patient  Records Review:   Pathology Reports, Previous Doctor Records, Previous Patient Records, POC Tool  Urine Test Review:   Urinalysis   07/31/17  PSA  Total PSA 3.41 ng/mL    PROCEDURES:         Flexible Cystoscopy - 52000  Risks, benefits, and some of the potential complications of the procedure were discussed at length with the patient including infection, bleeding, voiding discomfort, urinary retention, fever, chills, sepsis, and others. All questions were answered. Informed consent was obtained. Sterile technique and intraurethral analgesia were used.  Meatus:  Normal size. Normal location. Normal condition.  Urethra:  Mild fossa navicularis stricture.  External Sphincter:  Normal.  Verumontanum:  Normal.  Prostate:  Non-obstructing. No hyperplasia.  Bladder Neck:  Non-obstructing.  Ureteral Orifices:  Normal location. Normal size. Normal shape. Effluxed clear urine.  Bladder:  5 mm lesion on the posterior bladder wall on the right side      The lower urinary tract was carefully examined. The procedure was well-tolerated and without complications. Antibiotic instructions were given.  Instructions were given to call the office immediately for bloody urine, difficulty urinating, urinary retention, painful or frequent urination, fever, chills, nausea, vomiting or other illness. The patient stated that he understood these instructions and would comply with them.         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 We discussed options and I recommended we proceed to the operating room for removal and fulguration. At that point we can also provide some postoperative intravesical chemotherapy which will help reduce the risk of recurrence. We can also check his upper urinary tracts. Having gone through all this with the patient including the associated risk and benefits he opted to proceed. We will try to get this scheduled soon as possible.

## 2021-08-15 NOTE — Op Note (Signed)
Preoperative diagnosis:  Recurrent bladder cancer, 5 mm, anterior bladder wall  Postoperative diagnosis:  Same Pan anterior urethral stricture  Procedure: Cystoscopy, bilateral retrograde pyelogram with interpretation TURBT, 5 mm with fulguration Urethral dilation Postoperative instillation of gemcitabine  Surgeon: Ardis Hughs, MD  Anesthesia: General  Complications: None  Intraoperative findings:  #1: Patient's left retrograde pyelogram was performed with 10 cc of Omnipaque contrast and demonstrated normal caliber ureter with no filling defects or abnormalities.  There is no hydroureteronephrosis. 2.:  The patient's right retrograde pyelogram was performed in a similar manner and demonstrated no filling defects or abnormalities.  There were no hydroureter ureter nephrosis. 3: The patient had a 5 mm tumor in the bladder dome that I was able to remove with the rigid biopsy forceps and then fulgurated with the Bugbee cautery.  There were no other abnormalities on cystoscopy. #4: The patient had meatal stenosis and a narrow/stenosed anterior urethra requiring dilation with the [employer] sounds up to 72 [place].  EBL: Minimal  Specimens: None  Indication: George Morgan is a 80 y.o. patient with history of bladder cancer who was found to have recurrence of his cancer on surveillance cystoscopy..  After reviewing the management options for treatment, he elected to proceed with the above surgical procedure(s). We have discussed the potential benefits and risks of the procedure, side effects of the proposed treatment, the likelihood of the patient achieving the goals of the procedure, and any potential problems that might occur during the procedure or recuperation. Informed consent has been obtained.  Description of procedure:  The patient was taken to the operating room and general anesthesia was induced.  The patient was placed in the dorsal lithotomy position, prepped and draped  in the usual sterile fashion, and preoperative antibiotics were administered. A preoperative time-out was performed.   Started out with a [employer] sound, 45 [place] and dilated the fossa navicularis and anterior urethra in a serial fashion up to 22 [place].  I then was able to get the 21 French cystoscope gently to the patient's urethra into the bladder under visual guidance.  Cystoscopy was performed with the above findings.  I then performed retrograde pyelograms bilaterally with the above findings.  Subsequently removed the bladder tumor with the rigid biopsy forceps.  I fulgurated the area with the Bugbee cautery.  I reinspected the area and noted excellent hemostasis and no residual tumor.  There were no other abnormalities.  In the PACU, ~32mL's with 2gm of Gemcitabine was instilled into the patient's bladder through an 41 French Foley catheter.  This was allowed to dwell for 60-90 minutes prior to emptying the Foley catheter and removing it.   Disposition: The patient is being discharged home with no Foley catheter.  He has follow-up in 3 months for repeat cystoscopy.  Ardis Hughs, M.D.

## 2021-08-15 NOTE — Progress Notes (Signed)
Pt verbalizes that his sugar dropped throughout the night. This woke him out of his sleep. He took sugar tablets to keep his sugar up. Upon arrival CBG is 33. He denies any symptoms at this time. Pt educated to make [place] aware if he begins to feel symptomatic.   Dr. Ola Spurr made aware. Verbal orders for 1/2 amp D50.

## 2021-08-15 NOTE — Anesthesia Postprocedure Evaluation (Signed)
Anesthesia Post Note  Patient: Calton Harshfield  Procedure(s) Performed: TRANSURETHRAL RESECTION OF BLADDER TUMOR WITH GEMCITABINE BILATERAL RETROGRADE PYELOGRAM URETHRAL BALLOON DILATION (Bilateral: Bladder)     Patient location during evaluation: PACU Anesthesia Type: General Level of consciousness: awake and alert Pain management: pain level controlled Vital Signs Assessment: post-procedure vital signs reviewed and stable Respiratory status: spontaneous breathing, nonlabored ventilation and respiratory function stable Cardiovascular status: blood pressure returned to baseline and stable Postop Assessment: no apparent nausea or vomiting Anesthetic complications: no   No notable events documented.  Last Vitals:  Vitals:   08/15/21 1130 08/15/21 1145  BP: 110/63 120/74  Pulse: 71 72  Resp: 20 20  Temp:    SpO2: 98% 99%    Last Pain:  Vitals:   08/15/21 1145  TempSrc:   PainSc: 0-No pain                 Vashti Bolanos,W. EDMOND

## 2021-08-15 NOTE — Transfer of Care (Signed)
Immediate Anesthesia Transfer of Care Note  Patient: George Morgan  Procedure(s) Performed: TRANSURETHRAL RESECTION OF BLADDER TUMOR WITH GEMCITABINE BILATERAL RETROGRADE PYELOGRAM URETHRAL BALLOON DILATION (Bilateral: Bladder)  Patient Location: PACU  Anesthesia Type:General  Level of Consciousness: awake and patient cooperative  Airway & Oxygen Therapy: Patient Spontanous Breathing and Patient connected to face mask  Post-op Assessment: Report given to RN and Post -op Vital signs reviewed and stable  Post vital signs: Reviewed and stable  Last Vitals:  Vitals Value Taken Time  BP 111/67 08/15/21 1045  Temp    Pulse 73 08/15/21 1046  Resp 11 08/15/21 1046  SpO2 100 % 08/15/21 1046  Vitals shown include unvalidated device data.  Last Pain:  Vitals:   08/15/21 0810  TempSrc: Oral         Complications: No notable events documented.

## 2021-08-15 NOTE — Anesthesia Preprocedure Evaluation (Addendum)
Anesthesia Evaluation  Patient identified by MRN, date of birth, ID band Patient awake    Reviewed: Allergy & Precautions, H&P , NPO status , Patient's Chart, lab work & pertinent test results  Airway Mallampati: II  TM Distance: >3 FB Neck ROM: Full    Dental no notable dental hx. (+) Teeth Intact, Dental Advisory Given   Pulmonary neg pulmonary ROS, former smoker,    Pulmonary exam normal breath sounds clear to auscultation       Cardiovascular hypertension,  Rhythm:Regular Rate:Normal     Neuro/Psych negative neurological ROS  negative psych ROS   GI/Hepatic negative GI ROS, Neg liver ROS,   Endo/Other  diabetes, Insulin DependentMorbid obesity  Renal/GU Renal disease  negative genitourinary   Musculoskeletal  (+) Arthritis , Osteoarthritis,    Abdominal   Peds  Hematology negative hematology ROS (+)   Anesthesia Other Findings   Reproductive/Obstetrics negative OB ROS                            Anesthesia Physical Anesthesia Plan  ASA: 3  Anesthesia Plan: General   Post-op Pain Management: Tylenol PO (pre-op)   Induction: Intravenous  PONV Risk Score and Plan: 3 and Ondansetron, Dexamethasone and Midazolam  Airway Management Planned: LMA  Additional Equipment:   Intra-op Plan:   Post-operative Plan: Extubation in OR  Informed Consent: I have reviewed the patients History and Physical, chart, labs and discussed the procedure including the risks, benefits and alternatives for the proposed anesthesia with the patient or authorized representative who has indicated his/her understanding and acceptance.     Dental advisory given  Plan Discussed with: CRNA  Anesthesia Plan Comments:         Anesthesia Quick Evaluation

## 2021-08-15 NOTE — Discharge Instructions (Signed)

## 2021-08-16 ENCOUNTER — Encounter (HOSPITAL_COMMUNITY): Payer: Self-pay | Admitting: Urology

## 2021-08-17 LAB — SURGICAL PATHOLOGY

## 2021-10-08 DIAGNOSIS — H5203 Hypermetropia, bilateral: Secondary | ICD-10-CM | POA: Diagnosis not present

## 2021-10-08 DIAGNOSIS — H52203 Unspecified astigmatism, bilateral: Secondary | ICD-10-CM | POA: Diagnosis not present

## 2021-10-08 DIAGNOSIS — E113293 Type 2 diabetes mellitus with mild nonproliferative diabetic retinopathy without macular edema, bilateral: Secondary | ICD-10-CM | POA: Diagnosis not present

## 2021-10-17 DIAGNOSIS — C679 Malignant neoplasm of bladder, unspecified: Secondary | ICD-10-CM | POA: Diagnosis not present

## 2021-10-17 DIAGNOSIS — M5441 Lumbago with sciatica, right side: Secondary | ICD-10-CM | POA: Diagnosis not present

## 2021-10-17 DIAGNOSIS — I10 Essential (primary) hypertension: Secondary | ICD-10-CM | POA: Diagnosis not present

## 2021-10-17 DIAGNOSIS — G8929 Other chronic pain: Secondary | ICD-10-CM | POA: Diagnosis not present

## 2021-10-17 DIAGNOSIS — E1142 Type 2 diabetes mellitus with diabetic polyneuropathy: Secondary | ICD-10-CM | POA: Diagnosis not present

## 2021-10-17 DIAGNOSIS — E7800 Pure hypercholesterolemia, unspecified: Secondary | ICD-10-CM | POA: Diagnosis not present

## 2021-10-17 DIAGNOSIS — L409 Psoriasis, unspecified: Secondary | ICD-10-CM | POA: Diagnosis not present

## 2021-10-17 DIAGNOSIS — N183 Chronic kidney disease, stage 3 unspecified: Secondary | ICD-10-CM | POA: Diagnosis not present

## 2021-11-14 DIAGNOSIS — C678 Malignant neoplasm of overlapping sites of bladder: Secondary | ICD-10-CM | POA: Diagnosis not present

## 2021-12-03 DIAGNOSIS — M5451 Vertebrogenic low back pain: Secondary | ICD-10-CM | POA: Diagnosis not present

## 2022-02-04 DIAGNOSIS — L400 Psoriasis vulgaris: Secondary | ICD-10-CM | POA: Diagnosis not present

## 2022-02-04 DIAGNOSIS — L281 Prurigo nodularis: Secondary | ICD-10-CM | POA: Diagnosis not present

## 2022-03-19 DIAGNOSIS — R739 Hyperglycemia, unspecified: Secondary | ICD-10-CM | POA: Diagnosis not present

## 2022-03-24 DIAGNOSIS — N189 Chronic kidney disease, unspecified: Secondary | ICD-10-CM | POA: Diagnosis not present

## 2022-03-24 DIAGNOSIS — E1169 Type 2 diabetes mellitus with other specified complication: Secondary | ICD-10-CM | POA: Diagnosis not present

## 2022-03-24 DIAGNOSIS — E1142 Type 2 diabetes mellitus with diabetic polyneuropathy: Secondary | ICD-10-CM | POA: Diagnosis not present

## 2022-03-24 DIAGNOSIS — I10 Essential (primary) hypertension: Secondary | ICD-10-CM | POA: Diagnosis not present

## 2022-03-24 DIAGNOSIS — Z794 Long term (current) use of insulin: Secondary | ICD-10-CM | POA: Diagnosis not present

## 2022-03-24 DIAGNOSIS — E1122 Type 2 diabetes mellitus with diabetic chronic kidney disease: Secondary | ICD-10-CM | POA: Diagnosis not present

## 2022-03-24 DIAGNOSIS — E785 Hyperlipidemia, unspecified: Secondary | ICD-10-CM | POA: Diagnosis not present

## 2022-03-25 DIAGNOSIS — N189 Chronic kidney disease, unspecified: Secondary | ICD-10-CM | POA: Diagnosis not present

## 2022-03-25 DIAGNOSIS — E1142 Type 2 diabetes mellitus with diabetic polyneuropathy: Secondary | ICD-10-CM | POA: Diagnosis not present

## 2022-03-25 DIAGNOSIS — E1169 Type 2 diabetes mellitus with other specified complication: Secondary | ICD-10-CM | POA: Diagnosis not present

## 2022-03-25 DIAGNOSIS — Z794 Long term (current) use of insulin: Secondary | ICD-10-CM | POA: Diagnosis not present

## 2022-03-25 DIAGNOSIS — E785 Hyperlipidemia, unspecified: Secondary | ICD-10-CM | POA: Diagnosis not present

## 2022-03-25 DIAGNOSIS — I10 Essential (primary) hypertension: Secondary | ICD-10-CM | POA: Diagnosis not present

## 2022-04-03 DIAGNOSIS — E785 Hyperlipidemia, unspecified: Secondary | ICD-10-CM | POA: Diagnosis not present

## 2022-04-03 DIAGNOSIS — E1122 Type 2 diabetes mellitus with diabetic chronic kidney disease: Secondary | ICD-10-CM | POA: Diagnosis not present

## 2022-04-03 DIAGNOSIS — Z0000 Encounter for general adult medical examination without abnormal findings: Secondary | ICD-10-CM | POA: Diagnosis not present

## 2022-04-03 DIAGNOSIS — C679 Malignant neoplasm of bladder, unspecified: Secondary | ICD-10-CM | POA: Diagnosis not present

## 2022-04-03 DIAGNOSIS — I152 Hypertension secondary to endocrine disorders: Secondary | ICD-10-CM | POA: Diagnosis not present

## 2022-04-03 DIAGNOSIS — E1169 Type 2 diabetes mellitus with other specified complication: Secondary | ICD-10-CM | POA: Diagnosis not present

## 2022-04-03 DIAGNOSIS — Z794 Long term (current) use of insulin: Secondary | ICD-10-CM | POA: Diagnosis not present

## 2022-04-03 DIAGNOSIS — E1159 Type 2 diabetes mellitus with other circulatory complications: Secondary | ICD-10-CM | POA: Diagnosis not present

## 2022-06-10 DIAGNOSIS — L400 Psoriasis vulgaris: Secondary | ICD-10-CM | POA: Diagnosis not present

## 2022-06-12 DIAGNOSIS — C678 Malignant neoplasm of overlapping sites of bladder: Secondary | ICD-10-CM | POA: Diagnosis not present

## 2022-06-16 DIAGNOSIS — E7800 Pure hypercholesterolemia, unspecified: Secondary | ICD-10-CM | POA: Diagnosis not present

## 2022-06-16 DIAGNOSIS — N183 Chronic kidney disease, stage 3 unspecified: Secondary | ICD-10-CM | POA: Diagnosis not present

## 2022-06-16 DIAGNOSIS — Z0000 Encounter for general adult medical examination without abnormal findings: Secondary | ICD-10-CM | POA: Diagnosis not present

## 2022-06-16 DIAGNOSIS — E1142 Type 2 diabetes mellitus with diabetic polyneuropathy: Secondary | ICD-10-CM | POA: Diagnosis not present

## 2022-06-16 DIAGNOSIS — L409 Psoriasis, unspecified: Secondary | ICD-10-CM | POA: Diagnosis not present

## 2022-06-16 DIAGNOSIS — C679 Malignant neoplasm of bladder, unspecified: Secondary | ICD-10-CM | POA: Diagnosis not present

## 2022-06-16 DIAGNOSIS — I10 Essential (primary) hypertension: Secondary | ICD-10-CM | POA: Diagnosis not present

## 2022-07-04 DIAGNOSIS — E1142 Type 2 diabetes mellitus with diabetic polyneuropathy: Secondary | ICD-10-CM | POA: Diagnosis not present

## 2022-07-04 DIAGNOSIS — N183 Chronic kidney disease, stage 3 unspecified: Secondary | ICD-10-CM | POA: Diagnosis not present

## 2022-07-04 DIAGNOSIS — E7800 Pure hypercholesterolemia, unspecified: Secondary | ICD-10-CM | POA: Diagnosis not present

## 2022-07-04 DIAGNOSIS — I10 Essential (primary) hypertension: Secondary | ICD-10-CM | POA: Diagnosis not present

## 2022-11-27 DIAGNOSIS — C678 Malignant neoplasm of overlapping sites of bladder: Secondary | ICD-10-CM | POA: Diagnosis not present

## 2022-12-01 DIAGNOSIS — R946 Abnormal results of thyroid function studies: Secondary | ICD-10-CM | POA: Diagnosis not present

## 2022-12-16 DIAGNOSIS — I10 Essential (primary) hypertension: Secondary | ICD-10-CM | POA: Diagnosis not present

## 2022-12-16 DIAGNOSIS — E7800 Pure hypercholesterolemia, unspecified: Secondary | ICD-10-CM | POA: Diagnosis not present

## 2022-12-16 DIAGNOSIS — N183 Chronic kidney disease, stage 3 unspecified: Secondary | ICD-10-CM | POA: Diagnosis not present

## 2022-12-16 DIAGNOSIS — E1142 Type 2 diabetes mellitus with diabetic polyneuropathy: Secondary | ICD-10-CM | POA: Diagnosis not present

## 2022-12-16 DIAGNOSIS — C679 Malignant neoplasm of bladder, unspecified: Secondary | ICD-10-CM | POA: Diagnosis not present

## 2023-04-23 DIAGNOSIS — E7800 Pure hypercholesterolemia, unspecified: Secondary | ICD-10-CM | POA: Diagnosis not present

## 2023-04-23 DIAGNOSIS — E118 Type 2 diabetes mellitus with unspecified complications: Secondary | ICD-10-CM | POA: Diagnosis not present

## 2023-04-23 DIAGNOSIS — E1142 Type 2 diabetes mellitus with diabetic polyneuropathy: Secondary | ICD-10-CM | POA: Diagnosis not present

## 2023-04-23 DIAGNOSIS — E11319 Type 2 diabetes mellitus with unspecified diabetic retinopathy without macular edema: Secondary | ICD-10-CM | POA: Diagnosis not present

## 2023-04-23 DIAGNOSIS — N183 Chronic kidney disease, stage 3 unspecified: Secondary | ICD-10-CM | POA: Diagnosis not present

## 2023-04-23 DIAGNOSIS — R946 Abnormal results of thyroid function studies: Secondary | ICD-10-CM | POA: Diagnosis not present

## 2023-04-23 DIAGNOSIS — E669 Obesity, unspecified: Secondary | ICD-10-CM | POA: Diagnosis not present

## 2023-04-23 DIAGNOSIS — I10 Essential (primary) hypertension: Secondary | ICD-10-CM | POA: Diagnosis not present

## 2023-06-04 DIAGNOSIS — N35013 Post-traumatic anterior urethral stricture: Secondary | ICD-10-CM | POA: Diagnosis not present

## 2023-06-04 DIAGNOSIS — C678 Malignant neoplasm of overlapping sites of bladder: Secondary | ICD-10-CM | POA: Diagnosis not present

## 2023-07-01 DIAGNOSIS — E039 Hypothyroidism, unspecified: Secondary | ICD-10-CM | POA: Diagnosis not present

## 2023-07-01 DIAGNOSIS — Z0000 Encounter for general adult medical examination without abnormal findings: Secondary | ICD-10-CM | POA: Diagnosis not present

## 2023-07-01 DIAGNOSIS — E1142 Type 2 diabetes mellitus with diabetic polyneuropathy: Secondary | ICD-10-CM | POA: Diagnosis not present

## 2023-07-01 DIAGNOSIS — E7800 Pure hypercholesterolemia, unspecified: Secondary | ICD-10-CM | POA: Diagnosis not present

## 2023-07-01 DIAGNOSIS — Z23 Encounter for immunization: Secondary | ICD-10-CM | POA: Diagnosis not present

## 2023-07-01 DIAGNOSIS — I10 Essential (primary) hypertension: Secondary | ICD-10-CM | POA: Diagnosis not present

## 2023-07-01 DIAGNOSIS — N183 Chronic kidney disease, stage 3 unspecified: Secondary | ICD-10-CM | POA: Diagnosis not present

## 2023-07-01 DIAGNOSIS — C679 Malignant neoplasm of bladder, unspecified: Secondary | ICD-10-CM | POA: Diagnosis not present

## 2023-07-07 DIAGNOSIS — E1140 Type 2 diabetes mellitus with diabetic neuropathy, unspecified: Secondary | ICD-10-CM | POA: Diagnosis not present

## 2023-08-24 DIAGNOSIS — E1142 Type 2 diabetes mellitus with diabetic polyneuropathy: Secondary | ICD-10-CM | POA: Diagnosis not present

## 2023-08-24 DIAGNOSIS — N183 Chronic kidney disease, stage 3 unspecified: Secondary | ICD-10-CM | POA: Diagnosis not present

## 2023-08-24 DIAGNOSIS — E7800 Pure hypercholesterolemia, unspecified: Secondary | ICD-10-CM | POA: Diagnosis not present

## 2023-08-24 DIAGNOSIS — R946 Abnormal results of thyroid function studies: Secondary | ICD-10-CM | POA: Diagnosis not present

## 2023-08-24 DIAGNOSIS — E118 Type 2 diabetes mellitus with unspecified complications: Secondary | ICD-10-CM | POA: Diagnosis not present

## 2023-08-24 DIAGNOSIS — E669 Obesity, unspecified: Secondary | ICD-10-CM | POA: Diagnosis not present

## 2023-08-24 DIAGNOSIS — I10 Essential (primary) hypertension: Secondary | ICD-10-CM | POA: Diagnosis not present

## 2023-09-22 DIAGNOSIS — E1142 Type 2 diabetes mellitus with diabetic polyneuropathy: Secondary | ICD-10-CM | POA: Diagnosis not present

## 2023-10-26 DIAGNOSIS — M19011 Primary osteoarthritis, right shoulder: Secondary | ICD-10-CM | POA: Diagnosis not present

## 2023-11-26 DIAGNOSIS — E113293 Type 2 diabetes mellitus with mild nonproliferative diabetic retinopathy without macular edema, bilateral: Secondary | ICD-10-CM | POA: Diagnosis not present

## 2023-12-21 DIAGNOSIS — N183 Chronic kidney disease, stage 3 unspecified: Secondary | ICD-10-CM | POA: Diagnosis not present

## 2023-12-21 DIAGNOSIS — I10 Essential (primary) hypertension: Secondary | ICD-10-CM | POA: Diagnosis not present

## 2023-12-21 DIAGNOSIS — E11319 Type 2 diabetes mellitus with unspecified diabetic retinopathy without macular edema: Secondary | ICD-10-CM | POA: Diagnosis not present

## 2023-12-21 DIAGNOSIS — E1142 Type 2 diabetes mellitus with diabetic polyneuropathy: Secondary | ICD-10-CM | POA: Diagnosis not present

## 2023-12-21 DIAGNOSIS — E669 Obesity, unspecified: Secondary | ICD-10-CM | POA: Diagnosis not present

## 2023-12-21 DIAGNOSIS — R946 Abnormal results of thyroid function studies: Secondary | ICD-10-CM | POA: Diagnosis not present

## 2023-12-21 DIAGNOSIS — E118 Type 2 diabetes mellitus with unspecified complications: Secondary | ICD-10-CM | POA: Diagnosis not present

## 2023-12-31 DIAGNOSIS — E1142 Type 2 diabetes mellitus with diabetic polyneuropathy: Secondary | ICD-10-CM | POA: Diagnosis not present

## 2023-12-31 DIAGNOSIS — E039 Hypothyroidism, unspecified: Secondary | ICD-10-CM | POA: Diagnosis not present

## 2023-12-31 DIAGNOSIS — N183 Chronic kidney disease, stage 3 unspecified: Secondary | ICD-10-CM | POA: Diagnosis not present

## 2023-12-31 DIAGNOSIS — L409 Psoriasis, unspecified: Secondary | ICD-10-CM | POA: Diagnosis not present

## 2023-12-31 DIAGNOSIS — Z8551 Personal history of malignant neoplasm of bladder: Secondary | ICD-10-CM | POA: Diagnosis not present

## 2023-12-31 DIAGNOSIS — K625 Hemorrhage of anus and rectum: Secondary | ICD-10-CM | POA: Diagnosis not present

## 2023-12-31 DIAGNOSIS — I10 Essential (primary) hypertension: Secondary | ICD-10-CM | POA: Diagnosis not present

## 2024-01-11 DIAGNOSIS — K625 Hemorrhage of anus and rectum: Secondary | ICD-10-CM | POA: Diagnosis not present

## 2024-01-11 DIAGNOSIS — Z860100 Personal history of colon polyps, unspecified: Secondary | ICD-10-CM | POA: Diagnosis not present

## 2024-01-13 DIAGNOSIS — D124 Benign neoplasm of descending colon: Secondary | ICD-10-CM | POA: Diagnosis not present

## 2024-01-13 DIAGNOSIS — D125 Benign neoplasm of sigmoid colon: Secondary | ICD-10-CM | POA: Diagnosis not present

## 2024-01-13 DIAGNOSIS — K625 Hemorrhage of anus and rectum: Secondary | ICD-10-CM | POA: Diagnosis not present

## 2024-01-15 DIAGNOSIS — N35013 Post-traumatic anterior urethral stricture: Secondary | ICD-10-CM | POA: Diagnosis not present

## 2024-01-15 DIAGNOSIS — C678 Malignant neoplasm of overlapping sites of bladder: Secondary | ICD-10-CM | POA: Diagnosis not present

## 2024-01-15 DIAGNOSIS — D125 Benign neoplasm of sigmoid colon: Secondary | ICD-10-CM | POA: Diagnosis not present

## 2024-01-15 DIAGNOSIS — D124 Benign neoplasm of descending colon: Secondary | ICD-10-CM | POA: Diagnosis not present

## 2024-03-22 DIAGNOSIS — E1142 Type 2 diabetes mellitus with diabetic polyneuropathy: Secondary | ICD-10-CM | POA: Diagnosis not present
